# Patient Record
Sex: Female | Born: 1995 | Race: White | Hispanic: No | Marital: Married | Smoking: Never smoker
Health system: Southern US, Community
[De-identification: ages and names within clinical notes are randomized; demographics above are authoritative.]

## PROBLEM LIST (undated history)

## (undated) DIAGNOSIS — K8020 Calculus of gallbladder without cholecystitis without obstruction: Secondary | ICD-10-CM

## (undated) DIAGNOSIS — Q639 Congenital malformation of kidney, unspecified: Secondary | ICD-10-CM

## (undated) HISTORY — PX: NO PAST SURGERIES: SHX2092

## (undated) HISTORY — DX: Calculus of gallbladder without cholecystitis without obstruction: K80.20

---
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
## 2018-04-12 ENCOUNTER — Encounter: Payer: Self-pay | Admitting: Obstetrics and Gynecology

## 2018-04-12 ENCOUNTER — Ambulatory Visit (INDEPENDENT_AMBULATORY_CARE_PROVIDER_SITE_OTHER): Payer: 59 | Admitting: Obstetrics and Gynecology

## 2018-04-12 ENCOUNTER — Other Ambulatory Visit (HOSPITAL_COMMUNITY)
Admission: RE | Admit: 2018-04-12 | Discharge: 2018-04-12 | Disposition: A | Discharge status: Home / Self Care | Payer: 59 | Source: Ambulatory Visit | Attending: Obstetrics and Gynecology | Admitting: Obstetrics and Gynecology

## 2018-04-12 VITALS — BP 118/68 | HR 92 | Ht 62.0 in | Wt 168.0 lb

## 2018-04-12 DIAGNOSIS — Z113 Encounter for screening for infections with a predominantly sexual mode of transmission: Secondary | ICD-10-CM

## 2018-04-12 DIAGNOSIS — T8332XA Displacement of intrauterine contraceptive device, initial encounter: Secondary | ICD-10-CM

## 2018-04-12 DIAGNOSIS — Z0000 Encounter for general adult medical examination without abnormal findings: Secondary | ICD-10-CM

## 2018-04-12 DIAGNOSIS — Z01419 Encounter for gynecological examination (general) (routine) without abnormal findings: Secondary | ICD-10-CM | POA: Diagnosis not present

## 2018-04-12 NOTE — Patient Instructions (Signed)
Intrauterine Device Insertion, Care After    This sheet gives you information about how to care for yourself after your procedure. Your health care provider may also give you more specific instructions. If you have problems or questions, contact your health care provider.  What can I expect after the procedure?  After the procedure, it is common to have:  · Cramps and pain in the abdomen.  · Light bleeding (spotting) or heavier bleeding that is like your menstrual period. This may last for up to a few days.  · Lower back pain.  · Dizziness.  · Headaches.  · Nausea.  Follow these instructions at home:  · Before resuming sexual activity, check to make sure that you can feel the IUD string(s). You should be able to feel the end of the string(s) below the opening of your cervix. If your IUD string is in place, you may resume sexual activity.  ? If you had a hormonal IUD inserted more than 7 days after your most recent period started, you will need to use a backup method of birth control for 7 days after IUD insertion. Ask your health care provider whether this applies to you.  · Continue to check that the IUD is still in place by feeling for the string(s) after every menstrual period, or once a month.  · Take over-the-counter and prescription medicines only as told by your health care provider.  · Do not drive or use heavy machinery while taking prescription pain medicine.  · Keep all follow-up visits as told by your health care provider. This is important.  Contact a health care provider if:  · You have bleeding that is heavier or lasts longer than a normal menstrual cycle.  · You have a fever.  · You have cramps or abdominal pain that get worse or do not get better with medicine.  · You develop abdominal pain that is new or is not in the same area of earlier cramping and pain.  · You feel lightheaded or weak.  · You have abnormal or bad-smelling discharge from your vagina.  · You have pain during sexual  activity.  · You have any of the following problems with your IUD string(s):  ? The string bothers or hurts you or your sexual partner.  ? You cannot feel the string.  ? The string has gotten longer.  · You can feel the IUD in your vagina.  · You think you may be pregnant, or you miss your menstrual period.  · You think you may have an STI (sexually transmitted infection).  Get help right away if:  · You have flu-like symptoms.  · You have a fever and chills.  · You can feel that your IUD has slipped out of place.  Summary  · After the procedure, it is common to have cramps and pain in the abdomen. It is also common to have light bleeding (spotting) or heavier bleeding that is like your menstrual period.  · Continue to check that the IUD is still in place by feeling for the string(s) after every menstrual period, or once a month.  · Keep all follow-up visits as told by your health care provider. This is important.  · Contact your health care provider if you have problems with your IUD string(s), such as the string getting longer or bothering you or your sexual partner.  This information is not intended to replace advice given to you by your health care provider. Make   sure you discuss any questions you have with your health care provider.  Document Released: 10/01/2010 Document Revised: 12/25/2015 Document Reviewed: 12/25/2015  Elsevier Interactive Patient Education © 2019 [employer]

## 2018-04-12 NOTE — Progress Notes (Signed)
Gynecology Annual Exam  PCP: Myrene Buddy, NP  Chief Complaint:  Chief Complaint  Patient presents with  . Gynecologic Exam    Poss IUD removal     History of Present Illness: Patient is a 23 y.o. No obstetric history on file. presents for annual exam. The patient has no complaints today.   LMP: No LMP recorded. (Menstrual status: IUD). Menarche:not applicable Average Interval: irregular,monthly spotting for 1-2 days Duration of flow: 1 days Heavy Menses: no Clots: no Intermenstrual Bleeding: no Postcoital Bleeding: no Dysmenorrhea: no  The patient is sexually active. She currently uses IUD for contraception. She denies dyspareunia.  The patient does perform self breast exams.  There is no notable family history of breast or ovarian cancer in her family.  The patient wears seatbelts: yes.  The patient has regular exercise: no.    The patient denies current symptoms of depression.    Review of Systems: ROS  Past Medical History:  Past Medical History:  Diagnosis Date  . Cholelithiasis 05/2014  . 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 Vaginal delivery 04-2014    Past Surgical History:  Past Surgical History:  Procedure Laterality Date  . NO PAST SURGERIES    . ROBOTIC ASSISTED LAPAROSCOPIC CHOLECYSTECTOMY-SINGLE SITE N/A 08/16/2014   Procedure: ROBOTIC ASSISTED LAPAROSCOPIC CHOLECYSTECTOMY-SINGLE SITE;  Surgeon: Natale Lay, MD;  Location: [facility] ORS;  Service: General;  Laterality: N/A;    Gynecologic History:  No LMP recorded. (Menstrual status: IUD). Contraception: IUD   Obstetric History:  OB History  Gravida Para Term Preterm AB Living  1 1 1     1   SAB TAB Ectopic Multiple Live Births          1    # Outcome Date GA Lbr Len/2nd Weight Sex Delivery Anes PTL Lv  1 Term 05/09/14 [redacted]w[redacted]d  7 lb 1 oz (3.204 kg) F Vag-Spont   LIV     Family History:  Family History  Problem Relation Age of Onset  . Diabetes Mother   .  Hypertension Mother   . Diabetes Paternal Grandmother   . Heart disease Paternal Grandmother   . Hypertension Father     Social History:  Social History   Socioeconomic History  . Marital status: Married    Spouse name: Not on file  . Number of children: Not on file  . Years of education: Not on file  . Highest education level: Not on file  Occupational History  . Not on file  Social Needs  . Financial resource strain: Not on file  . Food insecurity:    Worry: Not on file    Inability: Not on file  . Transportation needs:    Medical: Not on file    Non-medical: Not on file  Tobacco Use  . Smoking status: Never Smoker  . Smokeless tobacco: Never Used  Substance and Sexual Activity  . Alcohol use: No  . Drug use: No  . Sexual activity: Yes    Birth control/protection: I.U.D.    Comment: Mirena   Lifestyle  . Physical activity:    Days per week: Not on file    Minutes per session: Not on file  . Stress: Not on file  Relationships  . Social connections:    Talks on phone: Not on file    Gets together: Not on file    Attends religious service: Not on file    Active member of  club or organization: Not on file    Attends meetings of clubs or organizations: Not on file    Relationship status: Not on file  . Intimate partner violence:    Fear of current or ex partner: Not on file    Emotionally abused: Not on file    Physically abused: Not on file    Forced sexual activity: Not on file  Other Topics Concern  . Not on file  Social History Narrative  . Not on file    Allergies:  No Known Allergies  Medications: Prior to Admission medications   Medication Sig Start Date End Date Taking? Authorizing Provider  HYDROcodone-acetaminophen (NORCO) 5-325 MG per tablet Take 1 tablet by mouth every 6 (six) hours as needed for moderate pain. Patient not taking: Reported on 08/29/2014 08/16/14   Natale Lay, MD  levonorgestrel (MIRENA) 20 MCG/24HR IUD 1 Intra Uterine Device by  Intrauterine route. Every 5 years    [provider]  ondansetron (ZOFRAN) 4 MG tablet Take 1 tablet (4 mg total) by mouth every 8 (eight) hours as needed for nausea or vomiting. 08/16/14   Natale Lay, MD    Physical Exam Vitals: Height  (1.575 m), weight 168 lb (76.2 kg).  General: NAD HEENT: normocephalic, anicteric Thyroid: no enlargement, no palpable nodules Pulmonary: No increased work of breathing, CTAB Cardiovascular: RRR, distal pulses 2+ Breast: Breast symmetrical, no tenderness, no palpable nodules or masses, no skin or nipple retraction present, no nipple discharge.  No axillary or supraclavicular lymphadenopathy. Abdomen: NABS, soft, non-tender, non-distended.  Umbilicus without lesions.  No hepatomegaly, splenomegaly or masses palpable. No evidence of hernia  Genitourinary:  External: Normal external female genitalia.  Normal urethral meatus, normal Bartholin's and Skene's glands.    Vagina: Normal vaginal mucosa, no evidence of prolapse.    Cervix: Grossly normal in appearance, no bleeding- IUD STRINGS NOT SEEN  Uterus: Non-enlarged, mobile, normal contour.  No CMT  Adnexa: ovaries non-enlarged, no adnexal masses  Rectal: deferred  Lymphatic: no evidence of inguinal lymphadenopathy Extremities: no edema, erythema, or tenderness Neurologic: Grossly intact Psychiatric: mood appropriate, affect full  Female chaperone present for pelvic and breast  portions of the physical exam    Assessment: 22 y.o. G1P1001. Routine annual exam  Plan: Problem List Items Addressed This Visit    None    Visit Diagnoses    Health care maintenance    -  Primary   Intrauterine contraceptive device threads lost, initial encounter       Relevant Orders   US PELVIS TRANSVANGINAL NON-OB (TV ONLY)      1) 4) Gardasil Series discussed and if applicable offered to patient - Patient is unsure if she has previously completed 3 shot series. She will verify then consider the HPV  vaccination  2) STI screening was offered and accepted.  3)  ASCCP guidelines and rational discussed.  Patient opts for initiating screening at 21 screening interval  4) Contraception - the patient is currently using  IUD.  She is happy with her current form of contraception and plans to continue. We discussed safe sex practices to reduce her furture risk of STI's.  IUD strings not seen. She will follow up in 1 week for a pelvic US. She is interested in having another pregnancy in the fall. Discussed initiating a prenatal vitamin for at least 1 month prior to conception. Discussed removing IUD when she is ready to start trying to conceive.   5) Return in about 1  week (around 04/19/2018) for Return GYN and [place].  Adelene Idler MD [facility], [facility] 04/12/2018 8:47 AM

## 2018-04-14 LAB — CERVICOVAGINAL ANCILLARY ONLY
CHLAMYDIA, DNA PROBE: NEGATIVE
Neisseria Gonorrhea: NEGATIVE

## 2018-04-15 NOTE — Progress Notes (Signed)
Please call patient with negative result. Thank you!

## 2018-04-19 NOTE — Progress Notes (Signed)
Left vm for pt

## 2018-04-21 ENCOUNTER — Encounter: Payer: Self-pay | Admitting: Obstetrics and Gynecology

## 2018-04-21 ENCOUNTER — Ambulatory Visit (INDEPENDENT_AMBULATORY_CARE_PROVIDER_SITE_OTHER): Payer: 59

## 2018-04-21 ENCOUNTER — Ambulatory Visit (INDEPENDENT_AMBULATORY_CARE_PROVIDER_SITE_OTHER): Payer: 59 | Admitting: Obstetrics and Gynecology

## 2018-04-21 VITALS — BP 116/72 | HR 81 | Ht 62.0 in | Wt 169.0 lb

## 2018-04-21 DIAGNOSIS — T8332XA Displacement of intrauterine contraceptive device, initial encounter: Secondary | ICD-10-CM

## 2018-04-21 DIAGNOSIS — Z30431 Encounter for routine checking of intrauterine contraceptive device: Secondary | ICD-10-CM

## 2018-04-21 DIAGNOSIS — T8332XD Displacement of intrauterine contraceptive device, subsequent encounter: Secondary | ICD-10-CM

## 2018-04-21 NOTE — Progress Notes (Signed)
Patient ID: Kathy Kelley, female   DOB: 03-26-1995, 22 y.o.   MRN: [id number]  Reason for Consult: Follow-up (GYN u/s and follow up )   Referred by Gauger, Hermenia Fiscal, *  Subjective:     HPI:  Kathy Kelley is a 23 y.o. female . She has no complaints. Following up today for lost IUD strings.   Past Medical History:  Diagnosis Date  . Cholelithiasis 05/2014  . 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 Vaginal delivery 04-2014   Family History  Problem Relation Age of Onset  . Diabetes Mother   . Hypertension Mother   . Diabetes Paternal Grandmother   . Heart disease Paternal Grandmother   . Hypertension Father    Past Surgical History:  Procedure Laterality Date  . NO PAST SURGERIES    . ROBOTIC ASSISTED LAPAROSCOPIC CHOLECYSTECTOMY-SINGLE SITE N/A 08/16/2014   Procedure: ROBOTIC ASSISTED LAPAROSCOPIC CHOLECYSTECTOMY-SINGLE SITE;  Surgeon: Natale Lay, MD;  Location: [facility] ORS;  Service: General;  Laterality: N/A;    Short Social History:  Social History   Tobacco Use  . Smoking status: Never Smoker  . Smokeless tobacco: Never Used  Substance Use Topics  . Alcohol use: No    No Known Allergies  Current Outpatient Medications  Medication Sig Dispense Refill  . levonorgestrel (MIRENA) 20 MCG/24HR IUD 1 Intra Uterine Device by Intrauterine route. Every 5 years    . HYDROcodone-acetaminophen (NORCO) 5-325 MG per tablet Take 1 tablet by mouth every 6 (six) hours as needed for moderate pain. (Patient not taking: Reported on 08/29/2014) 30 tablet 0  . ondansetron (ZOFRAN) 4 MG tablet Take 1 tablet (4 mg total) by mouth every 8 (eight) hours as needed for nausea or vomiting. (Patient not taking: Reported on 04/12/2018) 20 tablet 0   No current facility-administered medications for this visit.     Review of Systems  Constitutional: Negative for chills, fatigue, fever and unexpected weight change.  HENT: Negative for trouble swallowing.    Eyes: Negative for loss of vision.  Respiratory: Negative for cough, shortness of breath and wheezing.  Cardiovascular: Negative for chest pain, leg swelling, palpitations and syncope.  GI: Negative for abdominal pain, blood in stool, diarrhea, nausea and vomiting.  GU: Negative for difficulty urinating, dysuria, frequency and hematuria.  Musculoskeletal: Negative for back pain, leg pain and joint pain.  Skin: Negative for rash.  Neurological: Negative for dizziness, headaches, light-headedness, numbness and seizures.  Psychiatric: Negative for behavioral problem, confusion, depressed mood and sleep disturbance.        Objective:  Objective   Vitals:   04/21/18 0919  BP: 116/72  Pulse: 81  Weight: 169 lb (76.7 kg)  Height: 5\' 2"  (1.575 m)   Body mass index is 30.91 kg/m.  Physical Exam Vitals signs and nursing note reviewed.  Constitutional:      Appearance: She is well-developed.  HENT:     Head: Normocephalic and atraumatic.  Eyes:     Pupils: Pupils are equal, round, and reactive to light.  Cardiovascular:     Rate and Rhythm: Normal rate and regular rhythm.  Pulmonary:     Effort: Pulmonary effort is normal. No respiratory distress.  Skin:    General: Skin is warm and dry.  Neurological:     Mental Status: She is alert and oriented to person, place, and time.  Psychiatric:        Behavior: Behavior normal.  Thought Content: Thought content normal.        Judgment: Judgment normal.          Assessment/Plan:     23 yo with lost IUD strings IUD seen in correct position in the uterus.  Follow up as needed.   More than 5 minutes were spent face to face with the patient in the room with more than 50% of the time spent providing counseling and discussing the plan of management.   I have reviewed this ultrasound and the report. I agree with the above assessment and plan.  Adelene Idler MD [facility] 04/21/18 9:28  AM

## 2019-02-03 ENCOUNTER — Other Ambulatory Visit: Payer: Self-pay

## 2019-02-03 ENCOUNTER — Ambulatory Visit: Payer: 59 | Attending: Internal Medicine

## 2019-02-03 DIAGNOSIS — Z20822 Contact with and (suspected) exposure to covid-19: Secondary | ICD-10-CM

## 2019-02-04 LAB — NOVEL CORONAVIRUS, NAA: SARS-CoV-2, NAA: NOT DETECTED

## 2019-05-04 ENCOUNTER — Encounter: Payer: Self-pay | Admitting: Obstetrics and Gynecology

## 2019-05-04 ENCOUNTER — Ambulatory Visit (INDEPENDENT_AMBULATORY_CARE_PROVIDER_SITE_OTHER): Payer: 59 | Admitting: Obstetrics and Gynecology

## 2019-05-04 ENCOUNTER — Other Ambulatory Visit (HOSPITAL_COMMUNITY)
Admission: RE | Admit: 2019-05-04 | Discharge: 2019-05-04 | Disposition: A | Discharge status: Home / Self Care | Payer: 59 | Source: Ambulatory Visit | Attending: Obstetrics and Gynecology | Admitting: Obstetrics and Gynecology

## 2019-05-04 ENCOUNTER — Other Ambulatory Visit: Payer: Self-pay

## 2019-05-04 VITALS — BP 120/70 | Ht 62.0 in | Wt 173.0 lb

## 2019-05-04 DIAGNOSIS — Z30432 Encounter for removal of intrauterine contraceptive device: Secondary | ICD-10-CM

## 2019-05-04 DIAGNOSIS — Z113 Encounter for screening for infections with a predominantly sexual mode of transmission: Secondary | ICD-10-CM | POA: Diagnosis present

## 2019-05-04 DIAGNOSIS — Z124 Encounter for screening for malignant neoplasm of cervix: Secondary | ICD-10-CM | POA: Insufficient documentation

## 2019-05-04 DIAGNOSIS — Z3009 Encounter for other general counseling and advice on contraception: Secondary | ICD-10-CM

## 2019-05-04 NOTE — Progress Notes (Signed)
Patient ID: CECLIA Kelley, female   DOB: [birth year], 24 y.o.   MRN: [id number]  Reason for Consult: Follow-up (IUD removal, wants a different BC)   Referred by Gauger, Hermenia Fiscal, *  Subjective:     HPI:  Kathy Kelley is a 24 y.o. female she presents today for consultation regarding birth control.  She reports that her and her significant other are interested in attempting to conceive at some point in the next year although they are not currently ready.  She is considering changing from an IUD due to from fourth form of birth control and would like to discuss her options.  She reports that she has been happy with the IUD.  She does not have a monthly.  Although sometimes has noticed some light pink spotting.  She reports she had her IUD inserted in April 2016.  Past Medical History:  Diagnosis Date  . Cholelithiasis 05/2014  . 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 Vaginal delivery 04-2014   Family History  Problem Relation Age of Onset  . Diabetes Mother   . Hypertension Mother   . Diabetes Paternal Grandmother   . Heart disease Paternal Grandmother   . Hypertension Father    Past Surgical History:  Procedure Laterality Date  . NO PAST SURGERIES    . ROBOTIC ASSISTED LAPAROSCOPIC CHOLECYSTECTOMY-SINGLE SITE N/A 08/16/2014   Procedure: ROBOTIC ASSISTED LAPAROSCOPIC CHOLECYSTECTOMY-SINGLE SITE;  Surgeon: Natale Lay, MD;  Location: [facility] ORS;  Service: General;  Laterality: N/A;    Short Social History:  Social History   Tobacco Use  . Smoking status: Never Smoker  . Smokeless tobacco: Never Used  Substance Use Topics  . Alcohol use: No    No Known Allergies  Current Outpatient Medications  Medication Sig Dispense Refill  . HYDROcodone-acetaminophen (NORCO) 5-325 MG per tablet Take 1 tablet by mouth every 6 (six) hours as needed for moderate pain. (Patient not taking: Reported on 08/29/2014) 30 tablet 0  . levonorgestrel (MIRENA) 20  MCG/24HR IUD 1 Intra Uterine Device by Intrauterine route. Every 5 years    . ondansetron (ZOFRAN) 4 MG tablet Take 1 tablet (4 mg total) by mouth every 8 (eight) hours as needed for nausea or vomiting. (Patient not taking: Reported on 04/12/2018) 20 tablet 0   No current facility-administered medications for this visit.    Review of Systems  Constitutional: Negative for chills, fatigue, fever and unexpected weight change.  HENT: Negative for trouble swallowing.  Eyes: Negative for loss of vision.  Respiratory: Negative for cough, shortness of breath and wheezing.  Cardiovascular: Negative for chest pain, leg swelling, palpitations and syncope.  GI: Negative for abdominal pain, blood in stool, diarrhea, nausea and vomiting.  GU: Negative for difficulty urinating, dysuria, frequency and hematuria.  Musculoskeletal: Negative for back pain, leg pain and joint pain.  Skin: Negative for rash.  Neurological: Negative for dizziness, headaches, light-headedness, numbness and seizures.  Psychiatric: Negative for behavioral problem, confusion, depressed mood and sleep disturbance.        Objective:  Objective   Vitals:   05/04/19 0813  BP: 120/70  Weight: 173 lb (78.5 kg)  Height: 5\' 2"  (1.575 m)   Body mass index is 31.64 kg/m.  Physical Exam Vitals and nursing note reviewed.  Constitutional:      Appearance: She is well-developed.  HENT:     Head: Normocephalic and atraumatic.  Eyes:     Pupils: Pupils are equal, round,  and reactive to light.  Cardiovascular:     Rate and Rhythm: Normal rate and regular rhythm.  Pulmonary:     Effort: Pulmonary effort is normal. No respiratory distress.  Skin:    General: Skin is warm and dry.  Neurological:     Mental Status: She is alert and oriented to person, place, and time.  Psychiatric:        Behavior: Behavior normal.        Thought Content: Thought content normal.        Judgment: Judgment normal.        Assessment/Plan:      24 year old here for birth control consultation. Discussed her options of leaving the Mirena IUD in place for 1 more year since it has received FDA approval for 6 years versus removal of the IUD and transition to an alternative birth control method.  After a careful discussion of all of her birth control options, Dr. Jarrett Soho decided that she would like to leave the Mirena IUD in place and will return for a visit when she is ready to attempt conceiving.  Discussed initiation of prenatal vitamin for 2 3 months before attempting to conceive which can help reduce nausea and vomiting associated with pregnancy as well as risks for neural tube defects.  Age-appropriate screening: Pap smear performed today along with gonorrhea chlamydia testing  More than 25 minutes were spent face to face with the patient in the room with more than 50% of the time spent providing counseling and discussing the plan of management.   Adrian Prows MD [facility], [facility] 05/04/2019 8:45 AM

## 2019-05-10 LAB — CYTOLOGY - PAP
Chlamydia: NEGATIVE
Comment: NEGATIVE
Comment: NEGATIVE
Comment: NORMAL
Diagnosis: NEGATIVE
Neisseria Gonorrhea: NEGATIVE
Trichomonas: NEGATIVE

## 2020-08-28 ENCOUNTER — Ambulatory Visit (INDEPENDENT_AMBULATORY_CARE_PROVIDER_SITE_OTHER): Payer: 59 | Admitting: Obstetrics and Gynecology

## 2020-08-28 ENCOUNTER — Encounter: Payer: Self-pay | Admitting: Obstetrics and Gynecology

## 2020-08-28 ENCOUNTER — Other Ambulatory Visit: Payer: Self-pay

## 2020-08-28 VITALS — BP 142/86 | Ht 62.0 in | Wt 179.0 lb

## 2020-08-28 DIAGNOSIS — Z30432 Encounter for removal of intrauterine contraceptive device: Secondary | ICD-10-CM

## 2020-08-28 DIAGNOSIS — Z30011 Encounter for initial prescription of contraceptive pills: Secondary | ICD-10-CM

## 2020-08-28 MED ORDER — LO LOESTRIN FE 1 MG-10 MCG / 10 MCG PO TABS: 1.0000 | ORAL_TABLET | Freq: Every day | ORAL | 3 refills | Status: DC

## 2020-08-28 NOTE — Progress Notes (Signed)
   GYNECOLOGY OFFICE PROCEDURE NOTE  Kathy Kelley is a 25 y.o. G1P1001 here for Mirena IUD removal placed 1 year ago.   IUD Removal  Patient identified, informed consent performed, consent signed.  Patient was in the dorsal lithotomy position, normal external genitalia was noted.  A speculum was placed in the patient's vagina, normal discharge was noted, no lesions. The cervix was visualized, no lesions, no abnormal discharge.  The strings of the IUD were grasped and pulled using ring forceps. The strings of the IUD were not visualized, so [name] forceps were introduced into the endometrial cavity and the IUD was grasped and removed in its entirety.  Patient tolerated the procedure well.    Patient will use OCP for contraception.  Patient plans OCPs for contraception. Pros and cons and systemic estrogen discussed with patient. She is not breast feeding, nor does she have any other medical contra-indications to estrogen use as listed in WHO guidelines for contraceptive use.  While effective at preventing pregnancy combination oral contraceptive pills do not prevent transmission of sexually transmitted diseases and use of barrier methods for this purpose was discussed.   Vena Austria, MD, [facility], [facility]

## 2020-11-18 ENCOUNTER — Telehealth: Payer: Self-pay

## 2020-11-18 NOTE — Telephone Encounter (Signed)
Pt calling; has NOB appt 10/14th; needs nausea meds.  [phone]  Adv vitamin B6 10-24mg  q8hrs, unisom 25mg  at HS and 12.5 in am; wrist bands, nausea suckers, and ginger drops otc.

## 2020-11-29 ENCOUNTER — Other Ambulatory Visit (HOSPITAL_COMMUNITY)
Admission: RE | Admit: 2020-11-29 | Discharge: 2020-11-29 | Disposition: A | Discharge status: Home / Self Care | Payer: 59 | Source: Ambulatory Visit | Attending: Obstetrics | Admitting: Obstetrics

## 2020-11-29 ENCOUNTER — Encounter: Payer: Self-pay | Admitting: Obstetrics

## 2020-11-29 ENCOUNTER — Other Ambulatory Visit: Payer: Self-pay

## 2020-11-29 ENCOUNTER — Ambulatory Visit (INDEPENDENT_AMBULATORY_CARE_PROVIDER_SITE_OTHER): Payer: 59 | Admitting: Obstetrics

## 2020-11-29 VITALS — BP 122/74 | Wt 177.0 lb

## 2020-11-29 DIAGNOSIS — B9689 Other specified bacterial agents as the cause of diseases classified elsewhere: Secondary | ICD-10-CM | POA: Insufficient documentation

## 2020-11-29 DIAGNOSIS — O0991 Supervision of high risk pregnancy, unspecified, first trimester: Secondary | ICD-10-CM | POA: Diagnosis not present

## 2020-11-29 DIAGNOSIS — N912 Amenorrhea, unspecified: Secondary | ICD-10-CM

## 2020-11-29 DIAGNOSIS — O0990 Supervision of high risk pregnancy, unspecified, unspecified trimester: Secondary | ICD-10-CM

## 2020-11-29 DIAGNOSIS — Z113 Encounter for screening for infections with a predominantly sexual mode of transmission: Secondary | ICD-10-CM | POA: Diagnosis present

## 2020-11-29 DIAGNOSIS — Z3480 Encounter for supervision of other normal pregnancy, unspecified trimester: Secondary | ICD-10-CM

## 2020-11-29 DIAGNOSIS — Z3A01 Less than 8 weeks gestation of pregnancy: Secondary | ICD-10-CM | POA: Diagnosis present

## 2020-11-29 DIAGNOSIS — O219 Vomiting of pregnancy, unspecified: Secondary | ICD-10-CM

## 2020-11-29 DIAGNOSIS — O23591 Infection of other part of genital tract in pregnancy, first trimester: Secondary | ICD-10-CM | POA: Diagnosis not present

## 2020-11-29 MED ORDER — ONDANSETRON 4 MG PO TBDP: 4.0000 mg | ORAL_TABLET | Freq: Four times a day (QID) | ORAL | 0 refills | Status: DC | PRN

## 2020-11-29 NOTE — Progress Notes (Signed)
NOB today. No vb. No lof.  LMP 8/21. Pt has been very nauseas.

## 2020-11-29 NOTE — Progress Notes (Signed)
New Obstetric Patient H&P    Chief Complaint: "Desires prenatal care"   History of Present Illness: Patient is a 25 y.o. G2P1001 Not Hispanic or Latino female, LMP 10/06/2020 presents with amenorrhea and positive home pregnancy test. Based on her  LMP, her EDD is Estimated Date of Delivery: 07/13/21 and her EGA is [redacted]w[redacted]d. Cycles are 4. days, and she had only one period after removal of her IUD before conceiving., and occur approximately every : not applicable days. Her last pap smear was 1 years ago and was no abnormalities.    She had a urine pregnancy test which was positive about 2-3 week(s)  ago. Her last menstrual period was normal and lasted for  4 day(s). Since her LMP she claims she has experienced breast tenderness and nausea. She denies vaginal bleeding. Her past medical history is noncontributory. Her prior pregnancies are notable for none  Since her LMP, she admits to the use of tobacco products  no She claims she has gained   no pounds since the start of her pregnancy.  There are cats in the home in the home  no She admits close contact with children on a regular basis  no  She has had chicken pox in the past unknown She has had Tuberculosis exposures, symptoms, or previously tested positive for TB   no Current or past history of domestic violence. no  Genetic Screening/Teratology Counseling: (Includes patient, baby's father, or anyone in either family with:)   1. Patient's age >/= 71 at [facility]  no 2. Thalassemia ([place], [place], Mediterranean, or Asian background): MCV<80  no 3. Neural tube defect (meningomyelocele, spina bifida, anencephaly)  no 4. Congenital heart defect  no  5. Down syndrome  no 6. Tay-Sachs (Jewish, [place]  no 7. Canavan's Disease  no 8. Sickle cell disease or trait (African)  no  9. Hemophilia or other blood disorders  no  10. Muscular dystrophy  no  11. Cystic fibrosis  no  12. Huntington's Chorea  no  13. Mental retardation/autism   no 14. Other inherited genetic or chromosomal disorder  no 15. Maternal metabolic disorder (DM, PKU, etc)  no 16. Patient or FOB with a child with a birth defect not listed above no  16a. Patient or FOB with a birth defect themselves no 17. Recurrent pregnancy loss, or stillbirth  no  18. Any medications since LMP other than prenatal vitamins (include vitamins, supplements, OTC meds, drugs, alcohol)  no 19. Any other genetic/environmental exposure to discuss  no  Infection History:   1. Lives with someone with TB or TB exposed  no  2. Patient or partner has history of genital herpes  no 3. Rash or viral illness since LMP  no 4. History of STI (GC, CT, HPV, syphilis, HIV)  no 5. History of recent travel :  no  Other pertinent information:  no     Review of Systems:10 point review of systems negative unless otherwise noted in HPI  Past Medical History:  Past Medical History:  Diagnosis Date  . Cholelithiasis 05/2014  . 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 Vaginal delivery 04-2014    Past Surgical History:  Past Surgical History:  Procedure Laterality Date  . NO PAST SURGERIES    . ROBOTIC ASSISTED LAPAROSCOPIC CHOLECYSTECTOMY-SINGLE SITE N/A 08/16/2014   Procedure: ROBOTIC ASSISTED LAPAROSCOPIC CHOLECYSTECTOMY-SINGLE SITE;  Surgeon: Natale Lay, MD;  Location: [facility] ORS;  Service: General;  Laterality: N/A;    Gynecologic History: Patient's last menstrual period was 10/06/2020 (exact date).  Obstetric History: G2P1001  Family History:  Family History  Problem Relation Age of Onset  . Diabetes Mother   . Hypertension Mother   . Diabetes Paternal Grandmother   . Heart disease Paternal Grandmother   . Hypertension Father     Social History:  Social History   Socioeconomic History  . Marital status: Married    Spouse name: Not on file  . Number of children: Not on file  . Years of education: Not on file  . Highest education level: Not on  file  Occupational History  . Not on file  Tobacco Use  . Smoking status: Never  . Smokeless tobacco: Never  Vaping Use  . Vaping Use: Never used  Substance and Sexual Activity  . Alcohol use: No  . Drug use: No  . Sexual activity: Yes    Birth control/protection: I.U.D.    Comment: Mirena   Other Topics Concern  . Not on file  Social History Narrative  . Not on file   Social Determinants of Health   Financial Resource Strain: Not on file  Food Insecurity: Not on file  Transportation Needs: Not on file  Physical Activity: Not on file  Stress: Not on file  Social Connections: Not on file  Intimate Partner Violence: Not on file    Allergies:  No Known Allergies  Medications: Prior to Admission medications   Medication Sig Start Date End Date Taking? Authorizing Provider  ondansetron (ZOFRAN ODT) 4 MG disintegrating tablet Take 1 tablet (4 mg total) by mouth every 6 (six) hours as needed for nausea. 11/29/20  Yes Mirna Mires, CNM    Physical Exam Vitals: Blood pressure 122/74, weight 177 lb (80.3 kg), last menstrual period 10/06/2020.  General: NAD HEENT: normocephalic, anicteric Thyroid: no enlargement, no palpable nodules Pulmonary: No increased work of breathing, CTAB Cardiovascular: RRR, distal pulses 2+ Abdomen: NABS, soft, non-tender, non-distended.  Umbilicus without lesions.  No hepatomegaly, splenomegaly or masses palpable. No evidence of hernia  Genitourinary:  External: Normal external female genitalia.  Normal urethral meatus, normal  Bartholin's and Skene's glands.    Vagina: Normal vaginal mucosa, no evidence of prolapse.    Cervix: Grossly normal in appearance, no bleeding  Uterus: slightly retroverted,  Non-enlarged, mobile, normal contour.  No CMT  Adnexa: ovaries non-enlarged, no adnexal masses  Rectal: deferred Extremities: no edema, erythema, or tenderness Neurologic: Grossly intact Psychiatric: mood appropriate, affect  full   Assessment: 25 y.o. G2P1001 at [redacted]w[redacted]d presenting to initiate prenatal care  Plan: 1) Avoid alcoholic beverages. 2) Patient encouraged not to smoke.  3) Discontinue the use of all non-medicinal drugs and chemicals.  4) Take prenatal vitamins daily.  5) Nutrition, food safety (fish, cheese advisories, and high nitrite foods) and exercise discussed. 6) Hospital and practice style discussed with cross coverage system.  7) Genetic Screening, such as with 1st Trimester Screening, cell free fetal DNA, AFP testing, and Ultrasound, as well as with amniocentesis and CVS as appropriate, is discussed with patient. At the conclusion of today's visit patient requested genetic testing 8) Patient is asked about travel to areas at risk for the Zika virus, and counseled to avoid travel and exposure to mosquitoes or sexual partners who may have themselves been exposed to the virus. Testing is discussed, and will be ordered as appropriate.  The following were addressed during this visit:  Breastfeeding Education - Early initiation of  breastfeeding    Comments: Keeps milk supply adequate, helps contract uterus and slow bleeding, and early milk is the perfect first food and is easy to digest.   - The importance of exclusive breastfeeding    Comments: Provides antibodies, Lower risk of breast and ovarian cancers, and type-2 diabetes,Helps your body recover, Reduced chance of SIDS.   - Risks of giving your baby anything other than breast milk if you are breastfeeding    Comments: Make the baby less content with breastfeeds, may make my baby more susceptible to illness, and may reduce my milk supply.   - The importance of early skin-to-skin contact    Comments: Keeps baby warm and secure, helps keep baby's blood sugar up and breathing steady, easier to bond and breastfeed, and helps calm baby.  - Rooming-in on a 24-hour basis    Comments: Easier to learn baby's feeding cues, easier to bond and get to  know each other, and encourages milk production.   - Feeding on demand or baby-led feeding    Comments: Helps prevent breastfeeding complications, helps bring in good milk supply, prevents under or overfeeding, and helps baby feel content and satisfied   - Frequent feeding to help assure optimal milk production    Comments: Making a full supply of milk requires frequent removal of milk from breasts, infant will eat 8-12 times in 24 hours, if separated from infant use breast massage, hand expression and/ or pumping to remove milk from breasts.   - Effective positioning and attachment    Comments: Helps my baby to get enough breast milk, helps to produce an adequate milk supply, and helps prevent nipple pain and damage   - Exclusive breastfeeding for the first 6 months    Comments: Builds a healthy milk supply and keeps it up, protects baby from sickness and disease, and breastmilk has everything your baby needs for the first 6 months.   A dating scan is ordered for her next visit. She will return for her next ROB and have an early GTT due to her BMI. Pap smear today with cultures, she will have bloodwork , including the MaternT test at her next visit. RTC in 3 weeks to see an MD Mirna Mires, CNM  11/29/2020 12:32 PM

## 2020-12-02 LAB — CERVICOVAGINAL ANCILLARY ONLY
Bacterial Vaginitis (gardnerella): POSITIVE — AB
Candida Glabrata: NEGATIVE
Candida Vaginitis: NEGATIVE
Chlamydia: NEGATIVE
Comment: NEGATIVE
Comment: NEGATIVE
Comment: NEGATIVE
Comment: NEGATIVE
Comment: NEGATIVE
Comment: NORMAL
Neisseria Gonorrhea: NEGATIVE
Trichomonas: NEGATIVE

## 2020-12-03 ENCOUNTER — Other Ambulatory Visit: Payer: Self-pay | Admitting: Obstetrics

## 2020-12-03 DIAGNOSIS — B9689 Other specified bacterial agents as the cause of diseases classified elsewhere: Secondary | ICD-10-CM

## 2020-12-03 DIAGNOSIS — N760 Acute vaginitis: Secondary | ICD-10-CM

## 2020-12-03 MED ORDER — METRONIDAZOLE 500 MG PO TABS: 500.0000 mg | ORAL_TABLET | Freq: Two times a day (BID) | ORAL | 0 refills | Status: AC

## 2020-12-03 NOTE — Progress Notes (Signed)
+  BV noted at her NOB. Rx sent in for Metronidazole. Phone call to patient- left voicemail.  Mirna Mires, CNM  12/03/2020 11:09 AM

## 2020-12-04 LAB — URINE CULTURE

## 2020-12-09 ENCOUNTER — Other Ambulatory Visit: Payer: Self-pay

## 2020-12-09 DIAGNOSIS — O219 Vomiting of pregnancy, unspecified: Secondary | ICD-10-CM

## 2020-12-13 ENCOUNTER — Other Ambulatory Visit: Payer: Self-pay | Admitting: Obstetrics

## 2020-12-13 ENCOUNTER — Telehealth: Payer: Self-pay

## 2020-12-13 DIAGNOSIS — O219 Vomiting of pregnancy, unspecified: Secondary | ICD-10-CM

## 2020-12-13 MED ORDER — ONDANSETRON 4 MG PO TBDP: 4.0000 mg | ORAL_TABLET | Freq: Four times a day (QID) | ORAL | 0 refills | Status: DC | PRN

## 2020-12-13 NOTE — Telephone Encounter (Signed)
Pt calling; doesn't understand why rx for odansetron was denied.  She has four left and her next appt isn't until 12/23/20.  She will be pretty sick if she doesn't get the refills.  [phone]

## 2020-12-16 NOTE — Telephone Encounter (Signed)
Left detailed msg odansetron has been refilled.

## 2020-12-23 ENCOUNTER — Encounter: Payer: 59 | Admitting: Advanced Practice Midwife

## 2020-12-23 ENCOUNTER — Other Ambulatory Visit: Payer: 59

## 2020-12-24 ENCOUNTER — Other Ambulatory Visit: Payer: Self-pay

## 2020-12-24 ENCOUNTER — Ambulatory Visit (INDEPENDENT_AMBULATORY_CARE_PROVIDER_SITE_OTHER): Payer: 59 | Admitting: Obstetrics & Gynecology

## 2020-12-24 ENCOUNTER — Other Ambulatory Visit: Payer: Self-pay | Admitting: Obstetrics & Gynecology

## 2020-12-24 ENCOUNTER — Other Ambulatory Visit: Payer: 59

## 2020-12-24 VITALS — BP 120/70 | Wt 171.0 lb

## 2020-12-24 DIAGNOSIS — Z369 Encounter for antenatal screening, unspecified: Secondary | ICD-10-CM

## 2020-12-24 DIAGNOSIS — O0990 Supervision of high risk pregnancy, unspecified, unspecified trimester: Secondary | ICD-10-CM

## 2020-12-24 DIAGNOSIS — Z3A01 Less than 8 weeks gestation of pregnancy: Secondary | ICD-10-CM

## 2020-12-24 DIAGNOSIS — Z1379 Encounter for other screening for genetic and chromosomal anomalies: Secondary | ICD-10-CM

## 2020-12-24 DIAGNOSIS — Z3689 Encounter for other specified antenatal screening: Secondary | ICD-10-CM

## 2020-12-24 NOTE — Progress Notes (Signed)
  Subjective  Less nausea.  No pain or bleeding  Objective  BP 120/70   Wt 171 lb (77.6 kg)   LMP 10/06/2020 (Exact Date)   BMI 31.28 kg/m  General: NAD Pumonary: no increased work of breathing Abdomen: gravid, non-tender Extremities: no edema Psychiatric: mood appropriate, affect full  Assessment  25 y.o. G2P1001 at [redacted]w[redacted]d by  07/13/2021, by Last Menstrual Period presenting for routine prenatal visit  Plan   Problem List Items Addressed This Visit     Supervision of high risk pregnancy, antepartum    -  Primary   Prenatal screening encounter       Relevant Orders   Hepatitis C antibody, also Prenatal Labs   Encounter for genetic screening for Down Syndrome       Relevant Orders   MaterniT21 PLUS Core+SCA   Screening, antenatal, for fetal anatomic survey       Relevant Orders   US OB Comp + 14 Wk at 19-20 weeks    Labs today [place] today, see report.   Keep EDC.   Annamarie Major, MD, [facility], [facility] 12/24/2020  11:05 AM

## 2020-12-24 NOTE — Patient Instructions (Signed)
Recommendations to boost your immunity to prevent illness such as viral flu and colds, including covid19, are as follows:       - - -  Vitamin K2 and Vitamin D3  - - - Take Vitamin K2 at 200-300 mcg daily (usually 2-3 pills daily of the over the counter formulation). Take Vitamin D3 at 3000-4000 U daily (usually 3-4 pills daily of the over the counter formulation). Studies show that these two at high normal levels in your system are very effective in keeping your immunity so strong and protective that you will be unlikely to contract viral illness such as those listed above.  Dr Zerenity Bowron  Commonly Asked Questions During Pregnancy  Cats: A parasite can be excreted in cat feces.  To avoid exposure you need to have another person empty the little box.  If you must empty the litter box you will need to wear gloves.  Wash your hands after handling your cat.  This parasite can also be found in raw or undercooked meat so this should also be avoided.  Colds, Sore Throats, Flu: Please check your medication sheet to see what you can take for symptoms.  If your symptoms are unrelieved by these medications please call the office.  Dental Work: Most any dental work your dentist recommends is permitted.  X-rays should only be taken during the first trimester if absolutely necessary.  Your abdomen should be shielded with a lead apron during all x-rays.  Please notify your provider prior to receiving any x-rays.  Novocaine is fine; gas is not recommended.  If your dentist requires a note from us prior to dental work please call the office and we will provide one for you.  Exercise: Exercise is an important part of staying healthy during your pregnancy.  You may continue most exercises you were accustomed to prior to pregnancy.  Later in your pregnancy you will most likely notice you have difficulty with activities requiring balance like riding a bicycle.  It is important that you listen to your body and avoid  activities that put you at a higher risk of falling.  Adequate rest and staying well hydrated are a must!  If you have questions about the safety of specific activities ask your provider.    Exposure to Children with illness: Try to avoid obvious exposure; report any symptoms to us when noted,  If you have chicken pos, red measles or mumps, you should be immune to these diseases.   Please do not take any vaccines while pregnant unless you have checked with your OB provider.  Fetal Movement: After 28 weeks we recommend you do "kick counts" twice daily.  Lie or sit down in a calm quiet environment and count your baby movements "kicks".  You should feel your baby at least 10 times per hour.  If you have not felt 10 kicks within the first hour get up, walk around and have something sweet to eat or drink then repeat for an additional hour.  If count remains less than 10 per hour notify your provider.  Fumigating: Follow your pest control agent's advice as to how long to stay out of your home.  Ventilate the area well before re-entering.  Hemorrhoids:   Most over-the-counter preparations can be used during pregnancy.  Check your medication to see what is safe to use.  It is important to use a stool softener or fiber in your diet and to drink lots of liquids.  If hemorrhoids seem to be   getting worse please call the office.   Hot Tubs:  Hot tubs Jacuzzis and saunas are not recommended while pregnant.  These increase your internal body temperature and should be avoided.  Intercourse:  Sexual intercourse is safe during pregnancy as long as you are comfortable, unless otherwise advised by your provider.  Spotting may occur after intercourse; report any bright red bleeding that is heavier than spotting.  Labor:  If you know that you are in labor, please go to the hospital.  If you are unsure, please call the office and let us help you decide what to do.  Lifting, straining, etc:  If your job requires heavy  lifting or straining please check with your provider for any limitations.  Generally, you should not lift items heavier than that you can lift simply with your hands and arms (no back muscles)  Painting:  Paint fumes do not harm your pregnancy, but may make you ill and should be avoided if possible.  Latex or water based paints have less odor than oils.  Use adequate ventilation while painting.  Permanents & Hair Color:  Chemicals in hair dyes are not recommended as they cause increase hair dryness which can increase hair loss during pregnancy.  " Highlighting" and permanents are allowed.  Dye may be absorbed differently and permanents may not hold as well during pregnancy.  Sunbathing:  Use a sunscreen, as skin burns easily during pregnancy.  Drink plenty of fluids; avoid over heating.  Tanning Beds:  Because their possible side effects are still unknown, tanning beds are not recommended.  Ultrasound Scans:  Routine ultrasounds are performed at approximately 20 weeks.  You will be able to see your baby's general anatomy an if you would like to know the gender this can usually be determined as well.  If it is questionable when you conceived you may also receive an ultrasound early in your pregnancy for dating purposes.  Otherwise ultrasound exams are not routinely performed unless there is a medical necessity.  Although you can request a scan we ask that you pay for it when conducted because insurance does not cover " patient request" scans.  Work: If your pregnancy proceeds without complications you may work until your due date, unless your physician or employer advises otherwise.  Round Ligament Pain/Pelvic Discomfort:  Sharp, shooting pains not associated with bleeding are fairly common, usually occurring in the second trimester of pregnancy.  They tend to be worse when standing up or when you remain standing for long periods of time.  These are the result of pressure of certain pelvic ligaments  called "round ligaments".  Rest, Tylenol and heat seem to be the most effective relief.  As the womb and fetus grow, they rise out of the pelvis and the discomfort improves.  Please notify the office if your pain seems different than that described.  It may represent a more serious condition.   

## 2020-12-24 NOTE — Progress Notes (Signed)
ULTRASOUND REPORT  Location: [facility] Date of Service: 12/24/2020   Indications:Unsure LMP, one period after IUD removal Findings:  Singleton intrauterine pregnancy is visualized with a CRL consistent with [redacted]w[redacted]d gestation, giving an (U/S) EDD of 07/15/21. The (U/S) EDD is consistent with the clinically established EDD of 07/13/21.  FHR: 150 BPM CRL measurement: 41 mm Yolk sac is not visualized. Amnion: visualized and appears normal   Right Ovary is normal in appearance. Left Ovary is normal appearance. Corpus luteal cyst:  is not visualized Survey of the adnexa demonstrates no adnexal masses. There is no free peritoneal fluid in the cul de sac.  Impression: 1. [redacted]w[redacted]d Viable Singleton Intrauterine pregnancy by U/S. 2. (U/S) EDD is consistent with Clinically established EDD of 07/13/21.  Recommendations: 1.Clinical correlation with the patient's History and Physical Exam. 2. Keep EDC  Letitia Libra, MD

## 2020-12-26 LAB — RPR+RH+ABO+RUB AB+AB SCR+CB...
Antibody Screen: NEGATIVE
HIV Screen 4th Generation wRfx: NONREACTIVE
Hematocrit: 40.9 % (ref 34.0–46.6)
Hemoglobin: 13.8 g/dL (ref 11.1–15.9)
Hepatitis B Surface Ag: NEGATIVE
MCH: 29.7 pg (ref 26.6–33.0)
MCHC: 33.7 g/dL (ref 31.5–35.7)
MCV: 88 fL (ref 79–97)
Platelets: 145 10*3/uL — ABNORMAL LOW (ref 150–450)
RBC: 4.64 x10E6/uL (ref 3.77–5.28)
RDW: 12.1 % (ref 11.7–15.4)
RPR Ser Ql: NONREACTIVE
Rh Factor: POSITIVE
Rubella Antibodies, IGG: 4.31 index (ref >0.99)
Varicella zoster IgG: <135 index — ABNORMAL LOW (ref >165)
WBC: 3.8 10*3/uL (ref 3.4–10.8)

## 2020-12-29 LAB — MATERNIT21 PLUS CORE+SCA
Fetal Fraction: 7
Monosomy X (Turner Syndrome): NOT DETECTED
Result (T21): NEGATIVE
Trisomy 13 (Patau syndrome): NEGATIVE
Trisomy 18 (Edwards syndrome): NEGATIVE
Trisomy 21 (Down syndrome): NEGATIVE
XXX (Triple X Syndrome): NOT DETECTED
XXY (Klinefelter Syndrome): NOT DETECTED
XYY (Jacobs Syndrome): NOT DETECTED

## 2020-12-30 LAB — HEPATITIS C ANTIBODY: Hep C Virus Ab: <0.1 s/co ratio (ref 0.0–0.9)

## 2020-12-30 LAB — SPECIMEN STATUS REPORT

## 2021-01-03 ENCOUNTER — Other Ambulatory Visit: Payer: Self-pay | Admitting: Obstetrics

## 2021-01-03 DIAGNOSIS — O219 Vomiting of pregnancy, unspecified: Secondary | ICD-10-CM

## 2021-01-03 MED ORDER — ONDANSETRON 4 MG PO TBDP: 4.0000 mg | ORAL_TABLET | Freq: Four times a day (QID) | ORAL | 0 refills | Status: DC | PRN

## 2021-01-20 ENCOUNTER — Other Ambulatory Visit: Payer: Self-pay

## 2021-01-20 ENCOUNTER — Ambulatory Visit
Admission: RE | Admit: 2021-01-20 | Discharge: 2021-01-20 | Disposition: A | Discharge status: Home / Self Care | Payer: 59 | Source: Ambulatory Visit | Attending: Obstetrics & Gynecology | Admitting: Obstetrics & Gynecology

## 2021-01-20 DIAGNOSIS — Z3689 Encounter for other specified antenatal screening: Secondary | ICD-10-CM

## 2021-01-21 ENCOUNTER — Ambulatory Visit (INDEPENDENT_AMBULATORY_CARE_PROVIDER_SITE_OTHER): Payer: 59 | Admitting: Obstetrics

## 2021-01-21 VITALS — BP 122/74 | Wt 166.0 lb

## 2021-01-21 DIAGNOSIS — O0990 Supervision of high risk pregnancy, unspecified, unspecified trimester: Secondary | ICD-10-CM

## 2021-01-21 DIAGNOSIS — Z3A15 15 weeks gestation of pregnancy: Secondary | ICD-10-CM

## 2021-01-21 NOTE — Progress Notes (Signed)
No vb. No lof. Pt has been really nauseas, might want to try another nausea medication.

## 2021-01-21 NOTE — Progress Notes (Signed)
  Routine Prenatal Care Visit  Subjective  Kathy Kelley is a 25 y.o. G2P1001 at [redacted]w[redacted]d being seen today for ongoing prenatal care.  She is currently monitored for the following issues for this high-risk pregnancy and has Solitary kidney, congenital; Calculus of gallbladder without cholecystitis without obstruction; and Supervision of other normal pregnancy, antepartum on their problem list.  ----------------------------------------------------------------------------------- Patient reports nausea and vomiting.    . Vag. Bleeding: None.   . Leaking Fluid denies.  ----------------------------------------------------------------------------------- The following portions of the patient's history were reviewed and updated as appropriate: allergies, current medications, past family history, past medical history, past social history, past surgical history and problem list. Problem list updated.  Objective  Blood pressure 122/74, weight 166 lb (75.3 kg), last menstrual period 10/06/2020. Pregravid weight 177 lb (80.3 kg) Total Weight Gain -11 lb (-4.99 kg) Urinalysis: Urine Protein    Urine Glucose    Fetal Status:           General:  Alert, oriented and cooperative. Patient is in no acute distress.  Skin: Skin is warm and dry. No rash noted.   Cardiovascular: Normal heart rate noted  Respiratory: Normal respiratory effort, no problems with respiration noted  Abdomen: Soft, gravid, appropriate for gestational age. Pain/Pressure: Absent     Pelvic:  Cervical exam deferred        Extremities: Normal range of motion.     Mental Status: Normal mood and affect. Normal behavior. Normal judgment and thought content.   Assessment   25 y.o. G2P1001 at [redacted]w[redacted]d by  07/13/2021, by Last Menstrual Period presenting for routine prenatal visit  Plan   pregnancy Problems (from 11/29/20 to present)    Problem Noted Resolved   Supervision of other normal pregnancy, antepartum 11/29/2020 by Mirna Mires,  CNM No   Overview Addendum 12/26/2020 10:58 AM by Mirna Mires, CNM     Nursing Staff Provider  Office Location  [facility] Dating    Language  English Anatomy US    Flu Vaccine   Genetic Screen  NIPS:   TDaP vaccine    Hgb A1C or  GTT Early : Third trimester :   Covid    LAB RESULTS   Rhogam   Blood Type   B+  Feeding Plan  Antibody  negative  Contraception  Rubella  immune  Circumcision  RPR   NR  Pediatrician   HBsAg   neg  Support Person  HIV  NR  Prenatal Classes  Varicella Non immune    GBS  (For PCN allergy, check sensitivities)   BTL Consent     VBAC Consent  Pap      Hgb Electro      CF      SMA                   Preterm labor symptoms and general obstetric precautions including but not limited to vaginal bleeding, contractions, leaking of fluid and fetal movement were reviewed in detail with the patient. Please refer to After Visit Summary for other counseling recommendations.  Pepcid suggested for her ongling N/V. She takes zofran and also Vit B6 with Unisom. Her sono was ordered by [employer].  Return in about 4 weeks (around 02/18/2021) for return OB.  Mirna Mires, CNM  01/21/2021 9:06 AM

## 2021-01-22 ENCOUNTER — Other Ambulatory Visit: Payer: Self-pay

## 2021-01-22 ENCOUNTER — Other Ambulatory Visit: Payer: Self-pay | Admitting: Obstetrics & Gynecology

## 2021-01-22 ENCOUNTER — Ambulatory Visit
Admission: EM | Admit: 2021-01-22 | Discharge: 2021-01-22 | Disposition: A | Discharge status: Home / Self Care | Payer: 59 | Attending: Emergency Medicine | Admitting: Emergency Medicine

## 2021-01-22 ENCOUNTER — Encounter: Payer: Self-pay | Admitting: Emergency Medicine

## 2021-01-22 DIAGNOSIS — O219 Vomiting of pregnancy, unspecified: Secondary | ICD-10-CM

## 2021-01-22 DIAGNOSIS — Z3A15 15 weeks gestation of pregnancy: Secondary | ICD-10-CM

## 2021-01-22 DIAGNOSIS — O210 Mild hyperemesis gravidarum: Secondary | ICD-10-CM | POA: Diagnosis not present

## 2021-01-22 MED ORDER — ONDANSETRON HCL 4 MG/2ML IJ SOLN
4.0000 mg | Freq: Once | INTRAMUSCULAR | Status: AC
  Administered 2021-01-22: 4 mg via INTRAVENOUS

## 2021-01-22 MED ORDER — ONDANSETRON 8 MG PO TBDP: 8.0000 mg | ORAL_TABLET | Freq: Three times a day (TID) | ORAL | 0 refills | Status: DC | PRN

## 2021-01-22 MED ORDER — SODIUM CHLORIDE 0.9 % IV BOLUS
1000.0000 mL | Freq: Once | INTRAVENOUS | Status: AC
  Administered 2021-01-22: 1000 mL via INTRAVENOUS

## 2021-01-22 NOTE — Discharge Instructions (Signed)
Continue to take your doxylamine and B6 at bedtime for help with your nausea associated with pregnancy.  Use the Zofran ODT every 8 hours as needed for nausea and vomiting.  Continue to sip on clear liquids which include noncaffeinated soda such as ginger ale or Sprite, Pedialyte, Gatorade, water, and broth.  If your nausea vomiting returns or does not help with the medication either return for reevaluation or go to the ER for evaluation.

## 2021-01-22 NOTE — ED Triage Notes (Signed)
Pt presents with vomiting that started yesterday. Pt states she is unable to keep anything down.

## 2021-01-22 NOTE — ED Provider Notes (Signed)
[facility]    CSN: [id number] Arrival date & time: 01/22/21  0827      History   Chief Complaint Chief Complaint  Patient presents with   Emesis    HPI Kathy Kelley is a 25 y.o. female.   HPI  25 year old female here for evaluation of vomiting.  Patient is currently 15 weeks 3 days gestation and has been asked and vomiting her entireake nausea   pregnancy.  She reports that over the last 18 hours her nausea and vomiting has intensified and she has not been able to keep down any fluids.  She is also had a decreased appetite and has not been eating.  She denies any syncope or dizziness.  She has been prescribed Zofran by her OB/GYN but she has run out due to the limited supply she receives due to her insurance.  She is also taking B6 and doxylamine at bedtime which does help some with the nausea.  She has not To take it during the day as it makes her too somnolent.  She denies any fever, abdominal pain, or diarrhea. 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   Calculus of gallbladder without cholecystitis without obstruction 08/08/2014    Past Surgical History:  Procedure Laterality Date   NO PAST SURGERIES     ROBOTIC ASSISTED LAPAROSCOPIC CHOLECYSTECTOMY-SINGLE SITE N/A 08/16/2014   Procedure: ROBOTIC ASSISTED LAPAROSCOPIC CHOLECYSTECTOMY-SINGLE SITE;  Surgeon: Natale Lay, MD;  Location: [facility] ORS;  Service: General;  Laterality: N/A;    OB History     Gravida  2   Para  1   Term  1   Preterm      AB      Living  1      SAB      IAB      Ectopic      Multiple      Live Births  1            Home Medications    Prior to Admission medications   Medication Sig  Start Date End Date Taking? Authorizing Provider  ondansetron (ZOFRAN ODT) 4 MG disintegrating tablet Take 1 tablet (4 mg total) by mouth every 6 (six) hours as needed for nausea. 01/03/21  Yes Nadara Mustard, MD  ondansetron (ZOFRAN-ODT) 8 MG disintegrating tablet Take 1 tablet (8 mg total) by mouth every 8 (eight) hours as needed for nausea or vomiting. 01/22/21  Yes Becky Augusta, NP    Family History Family History  Problem Relation Age of Onset   Diabetes Mother    Hypertension Mother    Diabetes Paternal Grandmother    Heart disease Paternal Grandmother    Hypertension Father     Social History Social History   Tobacco Use   Smoking status: Never   Smokeless tobacco: Never  Vaping Use   Vaping Use: Never used  Substance Use Topics   Alcohol use: No   Drug use: No     Allergies   Patient has no known allergies.   Review of Systems Review of Systems  Constitutional:  Negative for activity change, appetite change and fever.  Gastrointestinal:  Positive for nausea and vomiting. Negative for abdominal pain  and diarrhea.  Neurological:  Negative for dizziness and syncope.  Hematological: Negative.     Physical Exam Triage Vital Signs ED Triage Vitals  Enc Vitals Group     BP 01/22/21 0916 127/83     Pulse Rate 01/22/21 0916 92     Resp --      Temp 01/22/21 0916 98.6 F (37 C)     Temp Source 01/22/21 0916 Oral     SpO2 01/22/21 0916 100 %     Weight --      Height --      Head Circumference --      Peak Flow --      Pain Score 01/22/21 0914 0     Pain Loc --      Pain Edu? --      Excl. in GC? --    No data found.  Updated Vital Signs BP 127/83 (BP Location: Left Arm)   Pulse 92   Temp 98.6 F (37 C) (Oral)   LMP 10/06/2020 (Exact Date)   SpO2 100%   Visual Acuity Right Eye Distance:   Left Eye Distance:   Bilateral Distance:    Right Eye Near:   Left Eye Near:    Bilateral Near:     Physical Exam Vitals and nursing note reviewed.   Constitutional:      General: She is not in acute distress.    Appearance: Normal appearance. She is normal weight. She is not ill-appearing.  HENT:     Head: Normocephalic and atraumatic.     Mouth/Throat:     Mouth: Mucous membranes are dry.     Pharynx: Oropharynx is clear. No posterior oropharyngeal erythema.     Comments: Patient has sticky oral mucous membranes. Eyes:     Extraocular Movements: Extraocular movements intact.     Pupils: Pupils are equal, round, and reactive to light.     Comments: Patient sclera are bright and shiny.  Cardiovascular:     Rate and Rhythm: Normal rate and regular rhythm.     Pulses: Normal pulses.     Heart sounds: Normal heart sounds. No murmur heard.   No gallop.  Pulmonary:     Effort: Pulmonary effort is normal.     Breath sounds: Normal breath sounds. No wheezing, rhonchi or rales.  Skin:    General: Skin is warm and dry.     Capillary Refill: Capillary refill takes less than 2 seconds.     Findings: No erythema or rash.  Neurological:     General: No focal deficit present.     Mental Status: She is alert and oriented to person, place, and time.  Psychiatric:        Mood and Affect: Mood normal.        Behavior: Behavior normal.        Thought Content: Thought content normal.        Judgment: Judgment normal.     UC Treatments / Results  Labs (all labs ordered are listed, but only abnormal results are displayed) Labs Reviewed - No data to display  EKG   Radiology No results found.  Procedures Procedures (including critical care time)  Medications Ordered in UC Medications  ondansetron (ZOFRAN) injection 4 mg (4 mg Intravenous Given 01/22/21 1201)  sodium chloride 0.9 % bolus 1,000 mL (1,000 mLs Intravenous New Bag/Given 01/22/21 1155)    Initial Impression / Assessment and Plan / UC Course  I have reviewed the triage vital signs  and the nursing notes.  Pertinent labs & imaging results that were available during my  care of the patient were reviewed by me and considered in my medical decision making (see chart for details).  Patient is a very pleasant, nontoxic-appearing 25 year old female who is currently [redacted] weeks pregnant here for evaluation of hyperemesis gravidarum.  She has been experiencing nausea and vomiting the entire length of her pregnancy which intensified 18 hours ago.  She states that she has not been able to keep down any fluids or popsicles.  She had a decreased appetite.  She is complaining primarily of a dry mouth.  She is had no syncope or dizziness.  On physical exam patient is alert and oriented and not in any acute distress.  Her sclera are bright and shiny.  Her oral mucous membranes are sticky but they are free of erythema or edema.  Cardiopulmonary exam shows clear lung sounds in all fields.  Patient has normal skin turgor.  Patient has been using Zofran but she has out due to limited supply that her insurance gives her each month.  She has requested a refill from her OB/GYN.  She is also taking the equivalent of diplegia's with doxylamine and B6 at bedtime.  Will give patient a liter of normal saline and IV Zofran.  She is sipping on water and is tolerating it at present.  Patient has received approximately 500 mL of normal saline.  She states that she is feeling better and is willing to try a p.o. challenge.  The water she was sitting on earlier want up inducing vomiting.  Her husband is going to get her Sprite.  Plan is to discharge patient home after IV fluids with a prescription for Zofran if she is able to tolerate p.o. challenge in clinic.  Patient IV fluids have finished and she has not had any further emesis.  Will discharge patient home with a diagnosis of hyperemesis gravidarum.   Final Clinical Impressions(s) / UC Diagnoses   Final diagnoses:  Hyperemesis gravidarum     Discharge Instructions      Continue to take your doxylamine and B6 at bedtime for help with your nausea  associated with pregnancy.  Use the Zofran ODT every 8 hours as needed for nausea and vomiting.  Continue to sip on clear liquids which include noncaffeinated soda such as ginger ale or Sprite, Pedialyte, Gatorade, water, and broth.  If your nausea vomiting returns or does not help with the medication either return for reevaluation or go to the ER for evaluation.     ED Prescriptions     Medication Sig Dispense Auth. Provider   ondansetron (ZOFRAN-ODT) 8 MG disintegrating tablet Take 1 tablet (8 mg total) by mouth every 8 (eight) hours as needed for nausea or vomiting. 20 tablet Becky Augusta, NP      PDMP not reviewed this encounter.   Becky Augusta, NP 01/22/21 1454

## 2021-02-11 ENCOUNTER — Ambulatory Visit
Admission: RE | Admit: 2021-02-11 | Discharge: 2021-02-11 | Disposition: A | Discharge status: Home / Self Care | Payer: 59 | Source: Ambulatory Visit | Attending: Obstetrics & Gynecology | Admitting: Obstetrics & Gynecology

## 2021-02-11 ENCOUNTER — Other Ambulatory Visit: Payer: Self-pay

## 2021-02-11 DIAGNOSIS — Z3689 Encounter for other specified antenatal screening: Secondary | ICD-10-CM | POA: Diagnosis present

## 2021-02-16 NOTE — L&D Delivery Note (Addendum)
Delivery Note At 1326 a viable female was delivered via  vaginal birth (Presentation:   OA with restitution to ROT   ).  The anterior should was delivered via gentle downward guidance. A compound hand was noted. The posterior shoulder then delivered with gentle upward guidance. Spontaneous resiprations noted, and the baby was placed on the maternal abdomen for tactile stimulation and bonding. The three vessel cord was clamped and cut only once it had stopped pulsing. APGAR:8.9, ; weight pending .   Placenta status:  ,intact  .  Cord:  3 vessel  with the following complications:  none.   Anesthesia:  epidural Episiotomy:  none Lacerations:  left labial laceration. Small vaginal tear. Suture Repair: 3.0 vicryl rapide Est. Blood Loss (mL):  pending  Mom to postpartum.  Baby to Couplet care / Skin to Skin.  Mirna Mires 07/20/2021, 2:05 PM

## 2021-02-18 ENCOUNTER — Other Ambulatory Visit: Payer: Self-pay

## 2021-02-18 ENCOUNTER — Ambulatory Visit (INDEPENDENT_AMBULATORY_CARE_PROVIDER_SITE_OTHER): Payer: 59 | Admitting: Obstetrics and Gynecology

## 2021-02-18 VITALS — BP 122/80 | Wt 161.0 lb

## 2021-02-18 DIAGNOSIS — Z3480 Encounter for supervision of other normal pregnancy, unspecified trimester: Secondary | ICD-10-CM

## 2021-02-18 DIAGNOSIS — Z3A19 19 weeks gestation of pregnancy: Secondary | ICD-10-CM

## 2021-02-18 DIAGNOSIS — Z362 Encounter for other antenatal screening follow-up: Secondary | ICD-10-CM

## 2021-02-18 LAB — POCT URINALYSIS DIPSTICK OB
Glucose, UA: NEGATIVE
POC,PROTEIN,UA: NEGATIVE

## 2021-02-18 NOTE — Progress Notes (Signed)
ROB - no concerns. RM 4 °

## 2021-02-18 NOTE — Progress Notes (Signed)
Routine Prenatal Care Visit  Subjective  Kathy Kelley is a 26 y.o. G2P1001 at [redacted]w[redacted]d being seen today for ongoing prenatal care.  She is currently monitored for the following issues for this low-risk pregnancy and has Calculus of gallbladder without cholecystitis without obstruction and Supervision of other normal pregnancy, antepartum on their problem list.  ----------------------------------------------------------------------------------- Patient reports no complaints.   Contractions: Not present. Vag. Bleeding: None.  Movement: Present. Denies leaking of fluid.  ----------------------------------------------------------------------------------- The following portions of the patient's history were reviewed and updated as appropriate: allergies, current medications, past family history, past medical history, past social history, past surgical history and problem list. Problem list updated.   Objective  Blood pressure 122/80, weight 161 lb (73 kg), last menstrual period 10/06/2020. Pregravid weight 177 lb (80.3 kg) Total Weight Gain -16 lb (-7.258 kg) Urinalysis:      Fetal Status: Fetal Heart Rate (bpm): 150   Movement: Present     General:  Alert, oriented and cooperative. Patient is in no acute distress.  Skin: Skin is warm and dry. No rash noted.   Cardiovascular: Normal heart rate noted  Respiratory: Normal respiratory effort, no problems with respiration noted  Abdomen: Soft, gravid, appropriate for gestational age. Pain/Pressure: Absent     Pelvic:  Cervical exam deferred        Extremities: Normal range of motion.     ental Status: Normal mood and affect. Normal behavior. Normal judgment and thought content.    There is no immunization history on file for this patient.   Assessment   26 y.o. G2P1001 at [redacted]w[redacted]d by  07/13/2021, by Last Menstrual Period presenting for routine prenatal visit  Plan   pregnancy Problems (from 11/29/20 to present)     Problem Noted  Resolved   Supervision of other normal pregnancy, antepartum 11/29/2020 by Mirna Mires, CNM No   Overview Addendum 12/26/2020 10:58 AM by Mirna Mires, CNM     Nursing Staff Provider  Office Location  [facility] Dating  LMP = 11 week [place]  Language  English Anatomy US  Incomplete RVOT small choroid plexus cyst  Flu Vaccine   Genetic Screen  NIPS: Normal XX  TDaP vaccine    Hgb A1C or  GTT Early : N/A Third trimester :   Covid    LAB RESULTS   Rhogam  N/A Blood Type   B+  Feeding Plan  Antibody  negative  Contraception  Rubella  immune  Circumcision  RPR   NR  Pediatrician   HBsAg   neg  Support Person Ryan HIV  NR  Prenatal Classes  Varicella Non immune    GBS  (For PCN allergy, check sensitivities)   BTL Consent     VBAC Consent N/A Pap 05/03/20 NILM  Pelvis Tested  7lbs 1oz Hgb Electro      CF      SMA                   Gestational age appropriate obstetric precautions including but not limited to vaginal bleeding, contractions, leaking of fluid and fetal movement were reviewed in detail with the patient.    1) Anatomy scan  - follow up for incomplete visualization of RVOT - isolated choroid plexus cyst in setting of negative NIPT negative T18 screening no further follow up    Return in about 4 weeks (around 03/18/2021) for ROB, [facility] [place] in 4 week .  Vena Austria, MD, [facility], [facility] 02/18/2021, 8:34 AM

## 2021-02-18 NOTE — Patient Instructions (Addendum)
Overview: Isolated choroid plexus cyst(s) are common findings, seen in 1-2% of normal fetuses in the second trimester.  When choroid plexus cyst(s) are identified, an experienced provider should perform a detailed fetal anatomic survey and assess for aneuploidy risk factors.  Diagnosis/definition: A choroid plexus cyst is a small fluid-filled structure within the choroid of the lateral ventricles of the fetal brain. Sonographically, choroid plexus cysts appear as echolucent cysts within the echogenic choroid (Figure 1). A choroid plexus cyst is not considered a structural or functional brain abnormality.  Epidemiology/Incidence: Choroid plexus cysts are identified in approximately 1% to 2% of fetuses in the second trimester and they occur equally in female and female fetuses.  Risk factors/associations: The only association of some significance between an isolated choroid plexus cyst and a possible fetal problem is with trisomy 41. When a fetus is affected by trisomy 71, multiple structural anomalies are almost always evident, including structural heart defects, clenched hands, talipes deformity of the feet, growth restriction, and polyhydramnios. When a structural anomaly is present in addition to choroid plexus cysts, the probability of trisomy 18 is 37%.o In the absence of associated sonographic abnormalities, the likelihood of trisomy 3 is extremely low in otherwise low-risk pregnancies.  Management:   Screening/Work-up:    Counseling for a woman after prenatal identification of a fetal choroid plexus cyst should be guided by the presence or absence of other sonographic markers or structural abnormalities, results of maternal screening for risk of trisomy 46 (if performed), and maternal age (Figure 2).  In women who screen negative for trisomy 17 (either first- or second-trimester screening) and in whom no other fetal structural abnormalities are visualized on a detailed ultrasound, the finding of an  isolated choroid plexus cyst does not require additional genetic testing.  More than 90% of choroid plexus cysts resolve, most often by 28 weeks. Studies evaluating neurodevelopmental outcomes in euploid children born after a prenatal diagnosis of choroid plexus cysts have not shown differences in neurocognitive ability, motor function, or behavior.  Therefore, neither serial antenatal ultrasounds nor post-natal evaluation are clinically useful.

## 2021-02-20 ENCOUNTER — Other Ambulatory Visit: Payer: Self-pay | Admitting: Obstetrics & Gynecology

## 2021-02-20 DIAGNOSIS — O219 Vomiting of pregnancy, unspecified: Secondary | ICD-10-CM

## 2021-02-21 ENCOUNTER — Other Ambulatory Visit: Payer: Self-pay | Admitting: Obstetrics & Gynecology

## 2021-02-21 MED ORDER — ONDANSETRON 8 MG PO TBDP: 8.0000 mg | ORAL_TABLET | Freq: Three times a day (TID) | ORAL | 2 refills | Status: DC | PRN

## 2021-03-17 ENCOUNTER — Ambulatory Visit
Admission: RE | Admit: 2021-03-17 | Discharge: 2021-03-17 | Disposition: A | Discharge status: Home / Self Care | Payer: 59 | Source: Ambulatory Visit | Attending: Obstetrics and Gynecology | Admitting: Obstetrics and Gynecology

## 2021-03-17 ENCOUNTER — Other Ambulatory Visit: Payer: Self-pay

## 2021-03-17 DIAGNOSIS — Z362 Encounter for other antenatal screening follow-up: Secondary | ICD-10-CM | POA: Diagnosis present

## 2021-03-17 DIAGNOSIS — Z3A23 23 weeks gestation of pregnancy: Secondary | ICD-10-CM | POA: Insufficient documentation

## 2021-03-17 DIAGNOSIS — Z3480 Encounter for supervision of other normal pregnancy, unspecified trimester: Secondary | ICD-10-CM

## 2021-03-20 ENCOUNTER — Encounter: Payer: Self-pay | Admitting: Obstetrics & Gynecology

## 2021-03-20 ENCOUNTER — Ambulatory Visit (INDEPENDENT_AMBULATORY_CARE_PROVIDER_SITE_OTHER): Payer: 59 | Admitting: Obstetrics & Gynecology

## 2021-03-20 ENCOUNTER — Other Ambulatory Visit: Payer: Self-pay

## 2021-03-20 VITALS — BP 110/70 | Wt 162.0 lb

## 2021-03-20 DIAGNOSIS — Z131 Encounter for screening for diabetes mellitus: Secondary | ICD-10-CM

## 2021-03-20 DIAGNOSIS — Z3482 Encounter for supervision of other normal pregnancy, second trimester: Secondary | ICD-10-CM

## 2021-03-20 DIAGNOSIS — Z3A23 23 weeks gestation of pregnancy: Secondary | ICD-10-CM

## 2021-03-20 NOTE — Patient Instructions (Signed)

## 2021-03-20 NOTE — Progress Notes (Signed)
°  Subjective  Fetal Movement? yes Contractions? no Leaking Fluid? no Vaginal Bleeding? no  Objective  BP 110/70    Wt 162 lb (73.5 kg)    LMP 10/06/2020 (Exact Date)    BMI 29.63 kg/m  General: NAD Pumonary: no increased work of breathing Abdomen: gravid, non-tender Extremities: no edema Psychiatric: mood appropriate, affect full  Assessment  26 y.o. G2P1001 at [redacted]w[redacted]d by  07/13/2021, by Last Menstrual Period presenting for routine prenatal visit  Plan   Problem List Items Addressed This Visit      Other   Supervision of other normal pregnancy, antepartum - Primary  Other Visit Diagnoses    [redacted] weeks gestation of pregnancy       Screening for diabetes mellitus       Relevant Orders   28 Week RH+Panel    PNV, FMC, Glucola nv Plans to breast feed Declines flu shot  pregnancy Problems (from 11/29/20 to present)    Problem Noted Resolved   Supervision of other normal pregnancy, antepartum 11/29/2020 by Mirna Mires, CNM No   Overview Addendum 03/20/2021  8:25 AM by Nadara Mustard, MD     Nursing Staff Provider  Office Location  [facility] Dating  LMP = 11 weeks [place]  Language  English Anatomy US  Incomplete RVOT small choroid plexus cyst; later resolved  Flu Vaccine  Declines Genetic Screen  NIPS: Normal XXXX  TDaP vaccine    Hgb A1C or  GTT Early : N/A Third trimester :   Covid no   LAB RESULTS   Rhogam  N/A Blood Type   B+  Feeding Plan Breast Antibody  negative  Contraception  Rubella  immune  Circumcision n/a RPR   NR  Pediatrician   HBsAg   neg  Support Person Ryan HIV  NR  Prenatal Classes  Varicella Non immune    GBS  (For PCN allergy, check sensitivities)   BTL Consent no    VBAC Consent N/A Pap  05/03/20 NILM  Pelvis Tested 7lbs 1oz               Annamarie Major, MD, FACOG [facility], [facility] 03/20/2021  8:26 AM

## 2021-04-22 ENCOUNTER — Other Ambulatory Visit: Payer: Self-pay

## 2021-04-22 ENCOUNTER — Other Ambulatory Visit: Payer: 59

## 2021-04-22 ENCOUNTER — Ambulatory Visit (INDEPENDENT_AMBULATORY_CARE_PROVIDER_SITE_OTHER): Payer: 59 | Admitting: Obstetrics and Gynecology

## 2021-04-22 VITALS — BP 104/80 | Wt 159.0 lb

## 2021-04-22 DIAGNOSIS — Z3A28 28 weeks gestation of pregnancy: Secondary | ICD-10-CM

## 2021-04-22 DIAGNOSIS — Z3483 Encounter for supervision of other normal pregnancy, third trimester: Secondary | ICD-10-CM

## 2021-04-22 DIAGNOSIS — Z131 Encounter for screening for diabetes mellitus: Secondary | ICD-10-CM

## 2021-04-22 NOTE — Progress Notes (Signed)
?  Subjective  ?Fetal Movement? yes ?Contractions? Yes; B-H contrxns occas ?Leaking Fluid? no ?Vaginal Bleeding? no ?PNVs? yes ? ?Objective  ?BP 104/80   Wt 159 lb (72.1 kg)   LMP 10/06/2020 (Exact Date)   BMI 29.08 kg/m?  ?General: NAD ?Pulmonary: no increased work of breathing ?Abdomen: gravid, non-tender ?Extremities: no edema ?Psychiatric: mood appropriate, affect full ? ?Assessment  ?26 y.o. G2P1001 at [redacted]w[redacted]d by  07/13/2021, by Last Menstrual Period presenting for routine prenatal visit ? ?Plan  ? ?Problem List Items Addressed This Visit   ?None ?Visit Diagnoses   ? ? Encounter for supervision of other normal pregnancy in third trimester    -  Primary  ? [redacted] weeks gestation of pregnancy      ? ?  ? ?Doing well. Still having GERD, taking pepcid/tums without relief.  ? ?Labs: 28 wk labs today ? ? ?Kelda Azad B. Govani Radloff, PA-C ?[facility],  ?04/22/2021  9:22 AM ? ?

## 2021-04-22 NOTE — Progress Notes (Signed)
ROB- 1 hr GTT, no concerns 

## 2021-04-23 ENCOUNTER — Other Ambulatory Visit: Payer: Self-pay | Admitting: Obstetrics & Gynecology

## 2021-04-23 ENCOUNTER — Telehealth: Payer: Self-pay

## 2021-04-23 DIAGNOSIS — O99810 Abnormal glucose complicating pregnancy: Secondary | ICD-10-CM

## 2021-04-23 LAB — 28 WEEK RH+PANEL
Basophils Absolute: 0 10*3/uL (ref 0.0–0.2)
Basos: 0 %
EOS (ABSOLUTE): 0.1 10*3/uL (ref 0.0–0.4)
Eos: 1 %
Gestational Diabetes Screen: 163 mg/dL — ABNORMAL HIGH (ref 70–139)
HIV Screen 4th Generation wRfx: NONREACTIVE
Hematocrit: 34.6 % (ref 34.0–46.6)
Hemoglobin: 11.4 g/dL (ref 11.1–15.9)
Immature Grans (Abs): 0 10*3/uL (ref 0.0–0.1)
Immature Granulocytes: 1 %
Lymphocytes Absolute: 1 10*3/uL (ref 0.7–3.1)
Lymphs: 17 %
MCH: 29.2 pg (ref 26.6–33.0)
MCHC: 32.9 g/dL (ref 31.5–35.7)
MCV: 89 fL (ref 79–97)
Monocytes Absolute: 0.2 10*3/uL (ref 0.1–0.9)
Monocytes: 3 %
Neutrophils Absolute: 4.7 10*3/uL (ref 1.4–7.0)
Neutrophils: 78 %
Platelets: 207 10*3/uL (ref 150–450)
RBC: 3.91 x10E6/uL (ref 3.77–5.28)
RDW: 12 % (ref 11.7–15.4)
RPR Ser Ql: NONREACTIVE
WBC: 5.9 10*3/uL (ref 3.4–10.8)

## 2021-04-23 NOTE — Telephone Encounter (Signed)
Pt is scheduled on Monday, 3/13. ?

## 2021-04-23 NOTE — Progress Notes (Signed)
Sch 3 hour GTT plz

## 2021-04-23 NOTE — Telephone Encounter (Signed)
-----   Message from Nadara Mustard, MD sent at 04/23/2021  7:34 AM EST ----- Sch 3 hour GTT plz

## 2021-04-23 NOTE — Telephone Encounter (Signed)
Contacted patient via phone. Left voicemail for patient to call back to be scheduled 

## 2021-04-28 ENCOUNTER — Other Ambulatory Visit: Payer: 59

## 2021-04-28 ENCOUNTER — Other Ambulatory Visit: Payer: Self-pay

## 2021-04-28 DIAGNOSIS — O99810 Abnormal glucose complicating pregnancy: Secondary | ICD-10-CM

## 2021-04-29 LAB — GESTATIONAL GLUCOSE TOLERANCE
Glucose, Fasting: 76 mg/dL (ref 70–94)
Glucose, GTT - 1 Hour: 181 mg/dL — ABNORMAL HIGH (ref 70–179)
Glucose, GTT - 2 Hour: 142 mg/dL (ref 70–154)
Glucose, GTT - 3 Hour: 91 mg/dL (ref 70–139)

## 2021-05-01 ENCOUNTER — Other Ambulatory Visit: Payer: Self-pay

## 2021-05-01 ENCOUNTER — Ambulatory Visit (INDEPENDENT_AMBULATORY_CARE_PROVIDER_SITE_OTHER): Payer: 59 | Admitting: Obstetrics

## 2021-05-01 VITALS — BP 100/60 | Wt 160.0 lb

## 2021-05-01 NOTE — Progress Notes (Signed)
ROB- no concerns 

## 2021-05-01 NOTE — Progress Notes (Signed)
Routine Prenatal Care Visit ? ?Subjective  ?Kathy Kelley is a 26 y.o. G2P1001 at [redacted]w[redacted]d being seen today for ongoing prenatal care.  She is currently monitored for the following issues for this low risk pregnancy and has Calculus of gallbladder without cholecystitis without obstruction and Supervision of other normal pregnancy, antepartum on their problem list.  ?----------------------------------------------------------------------------------- ?Patient reports good fetal movement.  ? .  .   . Leaking Fluid denies.  ?----------------------------------------------------------------------------------- ?The following portions of the patient's history were reviewed and updated as appropriate: allergies, current medications, past family history, past medical history, past social history, past surgical history and problem list. Problem list updated. ? ?Objective  ?Blood pressure 100/60, weight 160 lb (72.6 kg), last menstrual period 10/06/2020. ?Pregravid weight 177 lb (80.3 kg) Total Weight Gain -17 lb (-7.711 kg) ?Urinalysis: Urine Protein    Urine Glucose   ? ?Fetal Status:          ? ?General:  Alert, oriented and cooperative. Patient is in no acute distress.  ?Skin: Skin is warm and dry. No rash noted.   ?Cardiovascular: Normal heart rate noted  ?Respiratory: Normal respiratory effort, no problems with respiration noted  ?Abdomen: Soft, gravid, appropriate for gestational age.       ?Pelvic:  Cervical exam deferred        ?Extremities: Normal range of motion.     ?Mental Status: Normal mood and affect. Normal behavior. Normal judgment and thought content.  ? ?Assessment  ? ?26 y.o. G2P1001 at [redacted]w[redacted]d by  07/13/2021, by Last Menstrual Period presenting for routine prenatal visit ? ?Plan  ? ?pregnancy Problems (from 11/29/20 to present)   ? Problem Noted Resolved  ? Supervision of other normal pregnancy, antepartum 11/29/2020 by Mirna Mires, CNM No  ? Overview Addendum 03/20/2021  8:25 AM by Nadara Mustard, MD   ?   ?Nursing Staff Provider  ?Office Location  [facility] Dating  LMP = 11 weeks [place]  ?Language  English Anatomy US  Incomplete RVOT small choroid plexus cyst; later resolved  ?Flu Vaccine  Declines Genetic Screen  NIPS: Normal XXXX  ?TDaP vaccine    Hgb A1C or  ?GTT Early : N/A ?Third trimester :   ?Covid no   LAB RESULTS   ?Rhogam  N/A Blood Type   B+  ?Feeding Plan Breast Antibody  negative  ?Contraception  Rubella  immune  ?Circumcision n/a RPR   NR  ?Pediatrician   HBsAg   neg  ?Support Person Ryan HIV  NR  ?Prenatal Classes  Varicella Non immune  ?  GBS  (For PCN allergy, check sensitivities)   ?BTL Consent no    ?VBAC Consent N/A Pap  05/03/20 NILM  ?Pelvis Tested 7lbs 1oz    ? ? ?  ?  ?  ?  ? ?Term labor symptoms and general obstetric precautions including but not limited to vaginal bleeding, contractions, leaking of fluid and fetal movement were reviewed in detail with the patient. ?Please refer to After Visit Summary for other counseling recommendations.  ? ?Plan to administer Tdap next visit.  ? ?Return in about 2 weeks (around 05/15/2021) for return OB. ? ?Lamont Snowball, SNM ?Mirna Mires, CNM  ?05/01/2021 9:55 AM   ? ?

## 2021-05-15 ENCOUNTER — Ambulatory Visit (INDEPENDENT_AMBULATORY_CARE_PROVIDER_SITE_OTHER): Payer: 59 | Admitting: Obstetrics

## 2021-05-15 VITALS — BP 122/70 | Wt 161.0 lb

## 2021-05-15 DIAGNOSIS — Z23 Encounter for immunization: Secondary | ICD-10-CM | POA: Diagnosis not present

## 2021-05-15 DIAGNOSIS — Z3483 Encounter for supervision of other normal pregnancy, third trimester: Secondary | ICD-10-CM | POA: Diagnosis not present

## 2021-05-15 DIAGNOSIS — Z3480 Encounter for supervision of other normal pregnancy, unspecified trimester: Secondary | ICD-10-CM

## 2021-05-15 DIAGNOSIS — Z3A31 31 weeks gestation of pregnancy: Secondary | ICD-10-CM

## 2021-05-15 LAB — POCT URINALYSIS DIPSTICK OB
Glucose, UA: NEGATIVE
POC,PROTEIN,UA: NEGATIVE

## 2021-05-15 NOTE — Progress Notes (Signed)
TDAP today. No vb. No lof.  °

## 2021-05-15 NOTE — Addendum Note (Signed)
Addended by: Cleophas Dunker D on: 05/15/2021 10:23 AM ? ? Modules accepted: Orders ? ?

## 2021-05-15 NOTE — Addendum Note (Signed)
Addended by: Liliane Shi on: 05/15/2021 11:00 AM ? ? Modules accepted: Orders ? ?

## 2021-05-15 NOTE — Progress Notes (Signed)
Routine Prenatal Care Visit ? ?Subjective  ?Kathy Kelley is a 26 y.o. G2P1001 at [redacted]w[redacted]d being seen today for ongoing prenatal care.  She is currently monitored for the following issues for this low-risk pregnancy and has Calculus of gallbladder without cholecystitis without obstruction and Supervision of other normal pregnancy, antepartum on their problem list.  ?----------------------------------------------------------------------------------- ?Patient reports no complaints.  She has named her son Zenda Alpers. ?Contractions: Not present. Vag. Bleeding: None.  Movement: Present. Leaking Fluid denies.  ?----------------------------------------------------------------------------------- ?The following portions of the patient's history were reviewed and updated as appropriate: allergies, current medications, past family history, past medical history, past social history, past surgical history and problem list. Problem list updated. ? ?Objective  ?Blood pressure 122/70, weight 161 lb (73 kg), last menstrual period 10/06/2020. ?Pregravid weight 177 lb (80.3 kg) Total Weight Gain -16 lb (-7.258 kg) ?Urinalysis: Urine Protein    Urine Glucose   ? ?Fetal Status:     Movement: Present    ? ?General:  Alert, oriented and cooperative. Patient is in no acute distress.  ?Skin: Skin is warm and dry. No rash noted.   ?Cardiovascular: Normal heart rate noted  ?Respiratory: Normal respiratory effort, no problems with respiration noted  ?Abdomen: Soft, gravid, appropriate for gestational age. Pain/Pressure: Present     ?Pelvic:  Cervical exam deferred        ?Extremities: Normal range of motion.     ?Mental Status: Normal mood and affect. Normal behavior. Normal judgment and thought content.  ? ?Assessment  ? ?26 y.o. G2P1001 at [redacted]w[redacted]d by  07/13/2021, by Last Menstrual Period presenting for routine prenatal visit ? ?Plan  ? ?pregnancy Problems (from 11/29/20 to present)   ? Problem Noted Resolved  ? Supervision of other normal  pregnancy, antepartum 11/29/2020 by Mirna Mires, CNM No  ? Overview Addendum 05/01/2021 10:13 AM by Mirna Mires, CNM  ?   ?Nursing Staff Provider  ?Office Location  [facility] Dating  LMP = 11 weeks [place]  ?Language  English Anatomy US  Incomplete RVOT small choroid plexus cyst; later resolved  ?Flu Vaccine  Declines Genetic Screen  NIPS: Normal XXXX  ?TDaP vaccine    Hgb A1C or  ?GTT Early : N/A ?Third trimester :   ?Covid no   LAB RESULTS   ?Rhogam  N/A Blood Type   B+  ?Feeding Plan Breast Antibody  negative  ?Contraception  IUD Rubella  immune  ?Circumcision n/a RPR   NR  ?Pediatrician   HBsAg   neg  ?Support Person Ryan HIV  NR  ?Prenatal Classes  Varicella Non immune  ?  GBS  (For PCN allergy, check sensitivities)   ?BTL Consent no    ?VBAC Consent N/A Pap  05/03/20 NILM  ?Pelvis Tested 7lbs 1oz    ? ? ?  ?  ?  ?  ? ?Preterm labor symptoms and general obstetric precautions including but not limited to vaginal bleeding, contractions, leaking of fluid and fetal movement were reviewed in detail with the patient. ?Please refer to After Visit Summary for other counseling recommendations.  ? ?No follow-ups on file. ? ?Mirna Mires, CNM  ?05/15/2021 9:56 AM   ? ?

## 2021-05-27 ENCOUNTER — Ambulatory Visit (INDEPENDENT_AMBULATORY_CARE_PROVIDER_SITE_OTHER): Payer: 59 | Admitting: Obstetrics

## 2021-05-27 ENCOUNTER — Encounter: Payer: 59 | Admitting: Advanced Practice Midwife

## 2021-05-27 VITALS — BP 112/72 | Wt 160.0 lb

## 2021-05-27 DIAGNOSIS — Z3480 Encounter for supervision of other normal pregnancy, unspecified trimester: Secondary | ICD-10-CM

## 2021-05-27 DIAGNOSIS — Z3A33 33 weeks gestation of pregnancy: Secondary | ICD-10-CM

## 2021-05-27 LAB — POCT URINALYSIS DIPSTICK OB
Glucose, UA: NEGATIVE
POC,PROTEIN,UA: NEGATIVE

## 2021-05-27 NOTE — Patient Instructions (Signed)
34 Week Obstetrics Instructions  PRE-REGISTRATION  Please plan to pre-register at the hospital during the next week.  You can complete the form only by visiting [email or web address]   If you have any questions concerning this please contact the main number at [facility] [phone]  SIGNS OF LABOR  There is never an absolute way for an individual to determine if they ar truly in labor unless they are seen in the office or at the hospital.  However, please follow these guidelines in order to determine if you may potentially be in labor:  A.  Contractions 5 minutes apart for an hour (first baby)  B. Contractions 5 minutes apart for 20-30 minutes (if you have had a baby)  C. If you have any doubts   -If A, B, or C occur, please go to the hospital to be seen in Labor & Delivery [facility] [phone]-the nurse will check you and contact us. Please report to the hospital through the Emergency Room entrance and they will transport you to labor and delivery.  SPONTANEOUS RUPTURE OF MEMBRANES ("Water Breaks")  This is usually evidenced by a gush of fluid from the vagina.  If this occurs during office hours (and you are uncertain if your water has just broken), schedule ana appointment to come into the office to be checked.  After office hours and on weekends, go to the labor and delivery unit where the nurse will check you and contact us.  VAGINAL BLEEDING  A.  "Bloody Show" - this is a thick blood streaked vaginal discharge.  It is normal and should cause no alarm.  B. Heavy vaginal bleeding (similar to that of a period or heavier).  If this occurs, you should go IMMEDIATELY to the labor and delivery unit.  WHEN TO CALL  1. Routine Questions: please make a list of questions you may have and merely ask them at your next visit in the office.  2. Urgent or Worrisome Questions:  During office hours, please  call us at [phone] to leave a message with the nurse and/or provider for assistance.  After hours, the answering system will direct you as to which provider is on call and if your concern requires that they be paged.  If so, follow the instructions on the voicemail and the provider will contact you back at their earliest convenience.  3. Labor or Rupture of Membranes:  This will require an examination.  Please contact the office to schedule an appointment for exam.  After office hours and on the weekends, go to the Labor and Delivery unit at the hospital where the nurse will check you and call us.  You do not need to notify us at night that you are coming.  INSURANCE REGULATIONS/HOSPITAL DISCHARGE It is very important that you understand that not all delivers are the same.  Typically, a vaginal delivery requires a 2 day stay at the hospital and a cesarean delivery requires 4 days in the hospital for recovery.  It is your responsibility to know what time frame your insurance suggests you stay in the hospital for coverage purposes.  However, one must keep in mind that providers use their own judgment and discretion when determining when an individual is able to be discharged.    

## 2021-05-27 NOTE — Progress Notes (Signed)
Routine Prenatal Care Visit ? ?Subjective  ?Kathy Kelley is a 26 y.o. G2P1001 at [redacted]w[redacted]d being seen today for ongoing prenatal care.  She is currently monitored for the following issues for this low-risk pregnancy and has Calculus of gallbladder without cholecystitis without obstruction and Supervision of other normal pregnancy, antepartum on their problem list.  ?----------------------------------------------------------------------------------- ?Patient reports no complaints.   ?Contractions: Not present. Vag. Bleeding: None.  Movement: Present. Leaking Fluid denies.  ?----------------------------------------------------------------------------------- ?The following portions of the patient's history were reviewed and updated as appropriate: allergies, current medications, past family history, past medical history, past social history, past surgical history and problem list. Problem list updated. ? ?Objective  ?Blood pressure 112/72, weight 160 lb (72.6 kg), last menstrual period 10/06/2020. ?Pregravid weight 177 lb (80.3 kg) Total Weight Gain -17 lb (-7.711 kg) ?Urinalysis: Urine Protein    Urine Glucose   ? ?Fetal Status:     Movement: Present    ? ?General:  Alert, oriented and cooperative. Patient is in no acute distress.  ?Skin: Skin is warm and dry. No rash noted.   ?Cardiovascular: Normal heart rate noted  ?Respiratory: Normal respiratory effort, no problems with respiration noted  ?Abdomen: Soft, gravid, appropriate for gestational age. Pain/Pressure: Absent     ?Pelvic:  Cervical exam deferred        ?Extremities: Normal range of motion.     ?Mental Status: Normal mood and affect. Normal behavior. Normal judgment and thought content.  ? ?Assessment  ? ?26 y.o. G2P1001 at [redacted]w[redacted]d by  07/13/2021, by Last Menstrual Period presenting for routine prenatal visit ? ?Plan  ? ?pregnancy Problems (from 11/29/20 to present)   ? Problem Noted Resolved  ? Supervision of other normal pregnancy, antepartum 11/29/2020 by  Mirna Mires, CNM No  ? Overview Addendum 05/15/2021 10:13 AM by Mirna Mires, CNM  ?   ?Nursing Staff Provider  ?Office Location  [facility] Dating  LMP = 11 weeks [place]  ?Language  English Anatomy US  Incomplete RVOT small choroid plexus cyst; later resolved  ?Flu Vaccine  Declines Genetic Screen  NIPS: Normal XXXX  ?TDaP vaccine    Hgb A1C or  ?GTT Early : N/A ?Third trimester :   ?Covid no   LAB RESULTS   ?Rhogam  N/A Blood Type B/Positive/-- (11/08 7416) B+  ?Feeding Plan Breast Antibody Negative (11/08 0956)negative  ?Contraception  IUD Rubella 4.31 (11/08 0956)immune  ?Circumcision n/a RPR Non Reactive (03/07 0948) NR  ?Pediatrician   HBsAg Negative (11/08 0956) neg  ?Support Person Ryan HIV Non Reactive (03/07 0948)NR  ?Prenatal Classes  Varicella Non immune  ?  GBS  (For PCN allergy, check sensitivities)   ?BTL Consent no    ?VBAC Consent N/A Pap  05/03/20 NILM  ?Pelvis Tested 7lbs 1oz    ? ? ?  ?  ?  ?  ? ?Preterm labor symptoms and general obstetric precautions including but not limited to vaginal bleeding, contractions, leaking of fluid and fetal movement were reviewed in detail with the patient. ?Please refer to After Visit Summary for other counseling recommendations.  ? ?Return in about 2 weeks (around 06/10/2021) for return OB. ? ?Mirna Mires, CNM  ?05/27/2021 9:39 AM   ? ?

## 2021-06-10 ENCOUNTER — Ambulatory Visit (INDEPENDENT_AMBULATORY_CARE_PROVIDER_SITE_OTHER): Payer: 59 | Admitting: Obstetrics

## 2021-06-10 VITALS — BP 120/70 | Wt 160.0 lb

## 2021-06-10 DIAGNOSIS — Z3480 Encounter for supervision of other normal pregnancy, unspecified trimester: Secondary | ICD-10-CM

## 2021-06-10 DIAGNOSIS — Z3A35 35 weeks gestation of pregnancy: Secondary | ICD-10-CM

## 2021-06-10 NOTE — Progress Notes (Signed)
Routine Prenatal Care Visit ? ?Subjective  ?Kathy Kelley is a 26 y.o. G2P1001 at [redacted]w[redacted]d being seen today for ongoing prenatal care.  She is currently monitored for the following issues for this high-risk pregnancy and has Calculus of gallbladder without cholecystitis without obstruction and Supervision of other normal pregnancy, antepartum on their problem list.  ?----------------------------------------------------------------------------------- ?Patient reports no complaints.   ?Contractions: Not present. Vag. Bleeding: None.  Movement: Present. Leaking Fluid denies.  ?----------------------------------------------------------------------------------- ?The following portions of the patient's history were reviewed and updated as appropriate: allergies, current medications, past family history, past medical history, past social history, past surgical history and problem list. Problem list updated. ? ?Objective  ?Blood pressure 120/70, weight 160 lb (72.6 kg), last menstrual period 10/06/2020. ?Pregravid weight 177 lb (80.3 kg) Total Weight Gain -17 lb (-7.711 kg) ?Urinalysis: Urine Protein    Urine Glucose   ? ?Fetal Status:     Movement: Present    ? ?General:  Alert, oriented and cooperative. Patient is in no acute distress.  ?Skin: Skin is warm and dry. No rash noted.   ?Cardiovascular: Normal heart rate noted  ?Respiratory: Normal respiratory effort, no problems with respiration noted  ?Abdomen: Soft, gravid, appropriate for gestational age. Pain/Pressure: Present     ?Pelvic:  Cervical exam deferred        ?Extremities: Normal range of motion.     ?Mental Status: Normal mood and affect. Normal behavior. Normal judgment and thought content.  ? ?Assessment  ? ?26 y.o. G2P1001 at [redacted]w[redacted]d by  07/13/2021, by Last Menstrual Period presenting for routine prenatal visit ? ?Plan  ? ?pregnancy Problems (from 11/29/20 to present)   ? Problem Noted Resolved  ? Supervision of other normal pregnancy, antepartum 11/29/2020 by  Imagene Riches, CNM No  ? Overview Addendum 05/15/2021 10:13 AM by Imagene Riches, CNM  ?   ?Nursing Staff Provider  ?Office Location  [facility] Dating  LMP = 11 weeks [place]  ?Language  English Anatomy US  Incomplete RVOT small choroid plexus cyst; later resolved  ?Flu Vaccine  Declines Genetic Screen  NIPS: Normal XXXX  ?TDaP vaccine    Hgb A1C or  ?GTT Early : N/A ?Third trimester :   ?Covid no   LAB RESULTS   ?Rhogam  N/A Blood Type B/Positive/-- (11/08 [id number]) B+  ?Feeding Plan Breast Antibody Negative (11/08 0956)negative  ?Contraception  IUD Rubella 4.31 (11/08 0956)immune  ?Circumcision n/a RPR Non Reactive (03/07 0948) NR  ?Pediatrician   HBsAg Negative (11/08 0956) neg  ?Support Person Ryan HIV Non Reactive (03/07 0948)NR  ?Prenatal Classes  Varicella Non immune  ?  GBS  (For PCN allergy, check sensitivities)   ?BTL Consent no    ?VBAC Consent N/A Pap  05/03/20 NILM  ?Pelvis Tested 7lbs 1oz    ? ? ?  ?  ?  ?  ? ?Preterm labor symptoms and general obstetric precautions including but not limited to vaginal bleeding, contractions, leaking of fluid and fetal movement were reviewed in detail with the patient. ?Please refer to After Visit Summary for other counseling recommendations.  ?She will have her GBS and cultures next visit. ? ?No follow-ups on file. ? ?Imagene Riches, CNM  ?06/10/2021 10:35 AM   ?

## 2021-06-23 ENCOUNTER — Encounter: Payer: Self-pay | Admitting: Family Medicine

## 2021-06-23 ENCOUNTER — Other Ambulatory Visit (HOSPITAL_COMMUNITY)
Admission: RE | Admit: 2021-06-23 | Discharge: 2021-06-23 | Disposition: A | Discharge status: Home / Self Care | Payer: 59 | Source: Ambulatory Visit | Attending: Licensed Practical Nurse | Admitting: Licensed Practical Nurse

## 2021-06-23 ENCOUNTER — Ambulatory Visit (INDEPENDENT_AMBULATORY_CARE_PROVIDER_SITE_OTHER): Payer: 59 | Admitting: Family Medicine

## 2021-06-23 ENCOUNTER — Encounter: Payer: 59 | Admitting: Licensed Practical Nurse

## 2021-06-23 VITALS — BP 120/62 | Wt 157.0 lb

## 2021-06-23 DIAGNOSIS — K8020 Calculus of gallbladder without cholecystitis without obstruction: Secondary | ICD-10-CM

## 2021-06-23 DIAGNOSIS — Z3A37 37 weeks gestation of pregnancy: Secondary | ICD-10-CM | POA: Diagnosis present

## 2021-06-23 DIAGNOSIS — Z3480 Encounter for supervision of other normal pregnancy, unspecified trimester: Secondary | ICD-10-CM | POA: Insufficient documentation

## 2021-06-23 DIAGNOSIS — O99810 Abnormal glucose complicating pregnancy: Secondary | ICD-10-CM | POA: Insufficient documentation

## 2021-06-23 DIAGNOSIS — Q600 Renal agenesis, unilateral: Secondary | ICD-10-CM

## 2021-06-23 LAB — POCT URINALYSIS DIPSTICK OB
Glucose, UA: NEGATIVE
POC,PROTEIN,UA: NEGATIVE

## 2021-06-23 NOTE — Progress Notes (Signed)
? ? ?  PRENATAL VISIT NOTE ? ?Subjective:  ?Kathy Kelley is a 26 y.o. G2P1001 at [redacted]w[redacted]d being seen today for ongoing prenatal care.  She is currently monitored for the following issues for this low-risk pregnancy and has Kidney congenitally absent, right; Calculus of gallbladder without cholecystitis without obstruction; Supervision of other normal pregnancy, antepartum; and Abnormal glucose tolerance test (GTT) during pregnancy, antepartum on their problem list. ? ?Patient reports no complaints.  Contractions: Not present. Vag. Bleeding: None.  Movement: Present. Denies leaking of fluid.  ? ?The following portions of the patient's history were reviewed and updated as appropriate: allergies, current medications, past family history, past medical history, past social history, past surgical history and problem list.  ? ?Objective:  ? ?Vitals:  ? 06/23/21 0829  ?BP: 120/62  ?Weight: 157 lb (71.2 kg)  ? ?Fetal Status: Fetal Heart Rate (bpm): 140 Fundal Height: 37 cm Movement: Present    ? ?General:  Alert, oriented and cooperative. Patient is in no acute distress.  ?Skin: Skin is warm and dry. No rash noted.   ?Cardiovascular: Normal heart rate noted  ?Respiratory: Normal respiratory effort, no problems with respiration noted  ?Abdomen: Soft, gravid, appropriate for gestational age.  Pain/Pressure: Present     ?Pelvic: Cervical exam deferred        ?Extremities: Normal range of motion.  Edema: None  ?Mental Status: Normal mood and affect. Normal behavior. Normal judgment and thought content.  ? ?Assessment and Plan:  ?Pregnancy: G2P1001 at [redacted]w[redacted]d ?1. Calculus of gallbladder without cholecystitis without obstruction ? ?2. Supervision of other normal pregnancy, antepartum ?Up to date  ?TWG=-20 lb (-9.072 kg) ?Patient with weight loss in pregnancy -- recommended regular eating q 2-3 hours ?Confirmed VTX by BSUS today ?Desires IUD for contraception ? ?3. Abnormal glucose tolerance test (GTT) during pregnancy,  antepartum ?FH is appropriate ?1hr of 3hr test was 181.  ? ? ?Preterm labor symptoms and general obstetric precautions including but not limited to vaginal bleeding, contractions, leaking of fluid and fetal movement were reviewed in detail with the patient. ?Please refer to After Visit Summary for other counseling recommendations.  ? ?Return in about 1 week (around 06/30/2021) for Routine prenatal care. ? ?Future Appointments  ?Date Time Provider Department Center  ?07/02/2021  9:35 AM Constant, Gigi Gin, MD [facility] None  ? ? ?Federico Flake, MD ?

## 2021-06-24 LAB — COMPREHENSIVE METABOLIC PANEL
ALT: 14 IU/L (ref 0–32)
AST: 19 IU/L (ref 0–40)
Albumin/Globulin Ratio: 1.2 (ref 1.2–2.2)
Albumin: 3.3 g/dL — ABNORMAL LOW (ref 3.9–5.0)
Alkaline Phosphatase: 203 IU/L — ABNORMAL HIGH (ref 44–121)
BUN/Creatinine Ratio: 5 — ABNORMAL LOW (ref 9–23)
BUN: 3 mg/dL — ABNORMAL LOW (ref 6–20)
Bilirubin Total: 0.3 mg/dL (ref 0.0–1.2)
CO2: 19 mmol/L — ABNORMAL LOW (ref 20–29)
Calcium: 8.8 mg/dL (ref 8.7–10.2)
Chloride: 103 mmol/L (ref 96–106)
Creatinine, Ser: 0.65 mg/dL (ref 0.57–1.00)
Globulin, Total: 2.7 g/dL (ref 1.5–4.5)
Glucose: 103 mg/dL — ABNORMAL HIGH (ref 70–99)
Potassium: 4 mmol/L (ref 3.5–5.2)
Sodium: 138 mmol/L (ref 134–144)
Total Protein: 6 g/dL (ref 6.0–8.5)
eGFR: 127 mL/min/{1.73_m2} (ref >59)

## 2021-06-24 LAB — CERVICOVAGINAL ANCILLARY ONLY
Chlamydia: NEGATIVE
Comment: NEGATIVE
Comment: NORMAL
Neisseria Gonorrhea: NEGATIVE

## 2021-06-25 ENCOUNTER — Encounter: Payer: Self-pay | Admitting: Family Medicine

## 2021-06-25 ENCOUNTER — Other Ambulatory Visit: Payer: Self-pay | Admitting: Licensed Practical Nurse

## 2021-06-25 MED ORDER — ONDANSETRON 8 MG PO TBDP
8.0000 mg | ORAL_TABLET | Freq: Three times a day (TID) | ORAL | 2 refills | Status: DC | PRN
Start: 2021-06-25 — End: 2021-07-21

## 2021-06-25 NOTE — Progress Notes (Signed)
Pt requested refill for Zofran d/t nausea. Prescription reordered. ?Roberto Scales, CNM  ?Mosetta Pigeon, [place]  ?06/25/21  ?9:44 AM  ? ?

## 2021-06-26 ENCOUNTER — Encounter: Payer: 59 | Admitting: Advanced Practice Midwife

## 2021-06-27 LAB — CULTURE, BETA STREP (GROUP B ONLY): Strep Gp B Culture: NEGATIVE

## 2021-07-02 ENCOUNTER — Encounter: Payer: Self-pay | Admitting: Obstetrics and Gynecology

## 2021-07-02 ENCOUNTER — Ambulatory Visit (INDEPENDENT_AMBULATORY_CARE_PROVIDER_SITE_OTHER): Payer: 59 | Admitting: Obstetrics and Gynecology

## 2021-07-02 VITALS — BP 120/70 | Wt 156.0 lb

## 2021-07-02 DIAGNOSIS — Z3480 Encounter for supervision of other normal pregnancy, unspecified trimester: Secondary | ICD-10-CM

## 2021-07-02 LAB — POCT URINALYSIS DIPSTICK OB
Glucose, UA: NEGATIVE
POC,PROTEIN,UA: NEGATIVE

## 2021-07-02 NOTE — Progress Notes (Signed)
? ?  PRENATAL VISIT NOTE ? ?Subjective:  ?Kathy Kelley is a 26 y.o. G2P1001 at [redacted]w[redacted]d being seen today for ongoing prenatal care.  She is currently monitored for the following issues for this low-risk pregnancy and has Kidney congenitally absent, right; Calculus of gallbladder without cholecystitis without obstruction; Supervision of other normal pregnancy, antepartum; and Abnormal glucose tolerance test (GTT) during pregnancy, antepartum on their problem list. ? ?Patient reports no complaints.  Contractions: Irritability. Vag. Bleeding: None.  Movement: Present. Denies leaking of fluid.  ? ?The following portions of the patient's history were reviewed and updated as appropriate: allergies, current medications, past family history, past medical history, past social history, past surgical history and problem list.  ? ?Objective:  ? ?Vitals:  ? 07/02/21 0930  ?BP: 120/70  ?Weight: 156 lb (70.8 kg)  ? ? ?Fetal Status: Fetal Heart Rate (bpm): 140 Fundal Height: 38 cm Movement: Present    ? ?General:  Alert, oriented and cooperative. Patient is in no acute distress.  ?Skin: Skin is warm and dry. No rash noted.   ?Cardiovascular: Normal heart rate noted  ?Respiratory: Normal respiratory effort, no problems with respiration noted  ?Abdomen: Soft, gravid, appropriate for gestational age.  Pain/Pressure: Present     ?Pelvic: Cervical exam deferred        ?Extremities: Normal range of motion.     ?Mental Status: Normal mood and affect. Normal behavior. Normal judgment and thought content.  ? ?Assessment and Plan:  ?Pregnancy: G2P1001 at [redacted]w[redacted]d ?1. Supervision of other normal pregnancy, antepartum ?Patient is doing well without complaints ?Plans IUD for contraception ? ?Term labor symptoms and general obstetric precautions including but not limited to vaginal bleeding, contractions, leaking of fluid and fetal movement were reviewed in detail with the patient. ?Please refer to After Visit Summary for other counseling  recommendations.  ? ?Return in about 1 week (around 07/09/2021) for in person, ROB, Low risk. ? ?No future appointments. ? ?Catalina Antigua, MD ? ?

## 2021-07-02 NOTE — Addendum Note (Signed)
Addended by: Quintella Baton D on: 07/02/2021 09:49 AM ? ? Modules accepted: Orders ? ?

## 2021-07-08 ENCOUNTER — Ambulatory Visit (INDEPENDENT_AMBULATORY_CARE_PROVIDER_SITE_OTHER): Payer: 59 | Admitting: Family Medicine

## 2021-07-08 VITALS — BP 120/60 | Wt 155.0 lb

## 2021-07-08 DIAGNOSIS — Z3480 Encounter for supervision of other normal pregnancy, unspecified trimester: Secondary | ICD-10-CM

## 2021-07-08 DIAGNOSIS — Z3A39 39 weeks gestation of pregnancy: Secondary | ICD-10-CM

## 2021-07-08 LAB — POCT URINALYSIS DIPSTICK OB
Glucose, UA: NEGATIVE
POC,PROTEIN,UA: NEGATIVE

## 2021-07-08 NOTE — Progress Notes (Signed)
   PRENATAL VISIT NOTE  Subjective:  Kathy Kelley is a 26 y.o. G2P1001 at [redacted]w[redacted]d being seen today for ongoing prenatal care.  She is currently monitored for the following issues for this low-risk pregnancy and has Kidney congenitally absent, right; Calculus of gallbladder without cholecystitis without obstruction; Supervision of other normal pregnancy, antepartum; and Abnormal glucose tolerance test (GTT) during pregnancy, antepartum on their problem list.  Patient reports no complaints.  Contractions: Irritability. Vag. Bleeding: None.  Movement: Present. Denies leaking of fluid.   The following portions of the patient's history were reviewed and updated as appropriate: allergies, current medications, past family history, past medical history, past social history, past surgical history and problem list.   Objective:   Vitals:   07/08/21 1330  BP: 120/60  Weight: 155 lb (70.3 kg)    Fetal Status: Fetal Heart Rate (bpm): 145 Fundal Height: 39 cm Movement: Present  Presentation: Vertex  General:  Alert, oriented and cooperative. Patient is in no acute distress.  Skin: Skin is warm and dry. No rash noted.   Cardiovascular: Normal heart rate noted  Respiratory: Normal respiratory effort, no problems with respiration noted  Abdomen: Soft, gravid, appropriate for gestational age.  Pain/Pressure: Present     Pelvic: Cervical exam performed in the presence of a chaperone Dilation: 3 Effacement (%): 50 Station: -3  Extremities: Normal range of motion.  Edema: None  Mental Status: Normal mood and affect. Normal behavior. Normal judgment and thought content.   Assessment and Plan:  Pregnancy: G2P1001 at [redacted]w[redacted]d 1. Supervision of other normal pregnancy, antepartum Favorable cervix today Declined sweeping membranes Ready for baby at home No concerns today Recommended labor stations and waiting for ctx to be every 5 min for 1 hr and coming to hospital, or if very intense before. - POC  Urinalysis Dipstick OB  2. [redacted] weeks gestation of pregnancy - POC Urinalysis Dipstick OB  Preterm labor symptoms and general obstetric precautions including but not limited to vaginal bleeding, contractions, leaking of fluid and fetal movement were reviewed in detail with the patient. Please refer to After Visit Summary for other counseling recommendations.   Return in about 1 week (around 07/15/2021) for Routine prenatal care.  Future Appointments  Date Time Provider Department Center  07/15/2021  9:35 AM Dominic, Courtney Heys, CNM [facility] None    Federico Flake, MD

## 2021-07-15 ENCOUNTER — Ambulatory Visit (INDEPENDENT_AMBULATORY_CARE_PROVIDER_SITE_OTHER): Payer: 59 | Admitting: Licensed Practical Nurse

## 2021-07-15 VITALS — BP 120/72 | Wt 154.0 lb

## 2021-07-15 DIAGNOSIS — Z3480 Encounter for supervision of other normal pregnancy, unspecified trimester: Secondary | ICD-10-CM

## 2021-07-15 DIAGNOSIS — Z3A40 40 weeks gestation of pregnancy: Secondary | ICD-10-CM

## 2021-07-15 LAB — POCT URINALYSIS DIPSTICK OB
Glucose, UA: NEGATIVE
POC,PROTEIN,UA: NEGATIVE

## 2021-07-15 NOTE — Progress Notes (Unsigned)
Routine Prenatal Care Visit  Subjective  Kathy Kelley is a 26 y.o. G2P1001 at [redacted]w[redacted]d being seen today for ongoing prenatal care.  She is currently monitored for the following issues for this {Blank single:19197::"high-risk","low-risk"} pregnancy and has Kidney congenitally absent, right; Calculus of gallbladder without cholecystitis without obstruction; Supervision of other normal pregnancy, antepartum; and Abnormal glucose tolerance test (GTT) during pregnancy, antepartum on their problem list.  ----------------------------------------------------------------------------------- Patient reports {sx:14538}.   Contractions: Irritability. Vag. Bleeding: None.  Movement: Present. Leaking Fluid {Actions; denies/reports/admits to:19208}.  ----------------------------------------------------------------------------------- The following portions of the patient's history were reviewed and updated as appropriate: allergies, current medications, past family history, past medical history, past social history, past surgical history and problem list. Problem list updated.  Objective  Blood pressure 120/72, weight 154 lb (69.9 kg), last menstrual period 10/06/2020. Pregravid weight 177 lb (80.3 kg) Total Weight Gain -23 lb (-10.4 kg) Urinalysis: Urine Protein    Urine Glucose    Fetal Status:     Movement: Present     General:  Alert, oriented and cooperative. Patient is in no acute distress.  Skin: Skin is warm and dry. No rash noted.   Cardiovascular: Normal heart rate noted  Respiratory: Normal respiratory effort, no problems with respiration noted  Abdomen: Soft, gravid, appropriate for gestational age. Pain/Pressure: Present     Pelvic:  {Blank single:19197::"Cervical exam performed","Cervical exam deferred"}        Extremities: Normal range of motion.     Mental Status: Normal mood and affect. Normal behavior. Normal judgment and thought content.   Assessment   26 y.o. G2P1001 at [redacted]w[redacted]d by   07/13/2021, by Last Menstrual Period presenting for {Blank single:19197::"routine","work-in"} prenatal visit  Plan   pregnancy Problems (from 11/29/20 to present)    Problem Noted Resolved   Abnormal glucose tolerance test (GTT) during pregnancy, antepartum 06/23/2021 by Caren Macadam, MD No   Overview Addendum 06/23/2021  8:43 AM by Caren Macadam, MD    1 hr 163, Equivocal  76. 181, 142, 91--> if FH is > than expected get growth [place]      Supervision of other normal pregnancy, antepartum 11/29/2020 by Imagene Riches, CNM No   Overview Addendum 07/15/2021 10:08 AM by [place] Derry, Regan Staff Provider  Office Location  [facility] Dating  LMP = 11 weeks [place]  Language  English Anatomy US  Incomplete RVOT small choroid plexus cyst; later resolved  Flu Vaccine  Declines Genetic Screen  NIPS: Normal XX- sawyer  TDaP vaccine    Hgb A1C or  GTT Early : N/A Third trimester :  163-->  3hr (76, 181, 142,   Covid no   LAB RESULTS   Rhogam  N/A Blood Type B/Positive/-- (11/08 0956) B+  Feeding Plan Breast Antibody Negative (11/08 0956)negative  Contraception  IUD Rubella 4.31 (11/08 0956)immune  Circumcision n/a RPR Non Reactive (03/07 0948) NR  Pediatrician   HBsAg Negative (11/08 0956) neg  Support Person Ryan HIV Non Reactive (03/07 0948)NR  Prenatal Classes  Varicella Non immune    GBS Negative/-- (05/08 0900)(For PCN allergy, check sensitivities)   BTL Consent no    VBAC Consent N/A Pap  05/03/20 NILM  Pelvis Tested 7lbs 1oz               {Blank single:19197::"Term","Preterm"} labor symptoms and general obstetric precautions including but not limited to vaginal bleeding, contractions, leaking of fluid and fetal movement were reviewed in detail with the  patient. Please refer to After Visit Summary for other counseling recommendations.   No follow-ups on file.  @MYSIGNATURE @

## 2021-07-16 NOTE — Progress Notes (Signed)
OB History & Physical   History of Present Illness:  Chief Complaint:   HPI:  Kathy Kelley is a 26 y.o. G50P1001 female at [redacted]w[redacted]d dated by L and 11wkUS.  She presents to L&D for a postdates induction. She had early and regular prenatal care.  Her pregnancy has been uncomplicated.   +FM, occasional  CTX, no LOF, no VB  Pregnancy Issues: 1.Prepregnancy  BMI 32 2. Congenitally absent right kidney   Maternal Medical History:   Past Medical History:  Diagnosis Date  . Cholelithiasis 05/2014  . 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 Vaginal delivery 04-2014    Past Surgical History:  Procedure Laterality Date  . NO PAST SURGERIES    . ROBOTIC ASSISTED LAPAROSCOPIC CHOLECYSTECTOMY-SINGLE SITE N/A 08/16/2014   Procedure: ROBOTIC ASSISTED LAPAROSCOPIC CHOLECYSTECTOMY-SINGLE SITE;  Surgeon: Natale Lay, MD;  Location: [facility] ORS;  Service: General;  Laterality: N/A;    No Known Allergies  Prior to Admission medications   Medication Sig Start Date End Date Taking? Authorizing Provider  ondansetron (ZOFRAN-ODT) 8 MG disintegrating tablet Take 1 tablet (8 mg total) by mouth every 8 (eight) hours as needed for nausea or vomiting. 06/25/21  Yes Jackelyne Sayer, Courtney Heys, CNM  Prenatal Vit-Fe Fumarate-FA (MULTIVITAMIN-PRENATAL) 27-0.8 MG TABS tablet Take 1 tablet by mouth daily at 12 noon.   Yes [provider]     Prenatal care site:[facility]   Social History: She  reports that she has never smoked. She has never used smokeless tobacco. She reports that she does not drink alcohol and does not use drugs.  Family History: family history includes Diabetes in her mother and paternal grandmother; Heart disease in her paternal grandmother; Hypertension in her father and mother.   Review of Systems: A full review of systems was performed and negative except as noted in the HPI.     Physical Exam:  Vital Signs: BP 120/72   Wt 154 lb (69.9 kg)   LMP 10/06/2020  (Exact Date)   BMI 28.17 kg/m  General: no acute distress.  HEENT: normocephalic, atraumatic Heart: regular rate & rhythm.  No murmurs/rubs/gallops Lungs: clear to auscultation bilaterally, normal respiratory effort Abdomen: soft, gravid, non-tender;  EFW: 7lbs Pelvic:   External: Normal external female genitalia  Cervix: Dilation: 3.5 / Effacement (%): 50 / Station: -3    Extremities: non-tender, symmetric, no edema bilaterally.    Neurologic: Alert & oriented x 3.    Results for orders placed or performed in visit on 07/15/21 (from the past 24 hour(s))  POC Urinalysis Dipstick OB     Status: None   Collection Time: 07/15/21 10:20 AM  Result Value Ref Range   Color, UA     Clarity, UA     Glucose, UA Negative Negative   Bilirubin, UA     Ketones, UA     Spec Grav, UA     Blood, UA     pH, UA     POC,PROTEIN,UA Negative Negative, Trace, Small (1+), Moderate (2+), Large (3+), 4+   Urobilinogen, UA     Nitrite, UA     Leukocytes, UA     Appearance     Odor      Pertinent Results:  Prenatal Labs: Blood type/Rh B positive   Antibody screen neg  Rubella Immune  Varicella Non Immune  RPR NR  HBsAg Neg  HIV NR  GC neg  Chlamydia neg  Genetic screening negative  1 hour GTT  163  3 hour GTT 76, 181, 142, 91  GBS Negative     SVE:  Dilation: 3.5 / Effacement (%): 50 / Station: -3    Cephalic by leopolds  No results found.  Assessment:  Kathy Kelley is a 26 y.o. G22P1001 female at [redacted]w[redacted]d with postdates induction.   Plan:  1. Admit to Labor & Delivery 2. CBC, T&S, Clrs, IVF, pitocin  3. GBS  negative 4. Consents obtained. 5. Continuous efm/toco 6. Pain management aware of all options, will ask when desired   ----- Carie Caddy, CNM  [facility]

## 2021-07-18 ENCOUNTER — Encounter: Payer: Self-pay | Admitting: Licensed Practical Nurse

## 2021-07-20 ENCOUNTER — Encounter: Payer: Self-pay | Admitting: Obstetrics and Gynecology

## 2021-07-20 ENCOUNTER — Inpatient Hospital Stay: Payer: 59 | Admitting: Anesthesiology

## 2021-07-20 ENCOUNTER — Inpatient Hospital Stay
Admission: EM | Admit: 2021-07-20 | Discharge: 2021-07-21 | DRG: 806 | Disposition: A | Discharge status: Home / Self Care | Payer: 59 | Attending: Obstetrics and Gynecology | Admitting: Obstetrics and Gynecology

## 2021-07-20 ENCOUNTER — Other Ambulatory Visit: Payer: Self-pay

## 2021-07-20 DIAGNOSIS — O99613 Diseases of the digestive system complicating pregnancy, third trimester: Secondary | ICD-10-CM | POA: Diagnosis not present

## 2021-07-20 DIAGNOSIS — Z3A40 40 weeks gestation of pregnancy: Secondary | ICD-10-CM

## 2021-07-20 DIAGNOSIS — K8020 Calculus of gallbladder without cholecystitis without obstruction: Secondary | ICD-10-CM | POA: Diagnosis present

## 2021-07-20 DIAGNOSIS — Z3490 Encounter for supervision of normal pregnancy, unspecified, unspecified trimester: Secondary | ICD-10-CM | POA: Diagnosis present

## 2021-07-20 DIAGNOSIS — O2662 Liver and biliary tract disorders in childbirth: Secondary | ICD-10-CM | POA: Diagnosis present

## 2021-07-20 DIAGNOSIS — O09893 Supervision of other high risk pregnancies, third trimester: Secondary | ICD-10-CM | POA: Diagnosis not present

## 2021-07-20 DIAGNOSIS — Q600 Renal agenesis, unilateral: Secondary | ICD-10-CM | POA: Diagnosis not present

## 2021-07-20 DIAGNOSIS — Z3A41 41 weeks gestation of pregnancy: Secondary | ICD-10-CM

## 2021-07-20 DIAGNOSIS — O480 Post-term pregnancy: Secondary | ICD-10-CM | POA: Diagnosis present

## 2021-07-20 DIAGNOSIS — Z3480 Encounter for supervision of other normal pregnancy, unspecified trimester: Secondary | ICD-10-CM

## 2021-07-20 DIAGNOSIS — O99810 Abnormal glucose complicating pregnancy: Principal | ICD-10-CM

## 2021-07-20 LAB — RAPID HIV SCREEN (HIV 1/2 AB+AG)
HIV 1/2 Antibodies: NONREACTIVE
HIV-1 P24 Antigen - HIV24: NONREACTIVE

## 2021-07-20 LAB — CBC
HCT: 38.7 % (ref 36.0–46.0)
Hemoglobin: 13 g/dL (ref 12.0–15.0)
MCH: 29 pg (ref 26.0–34.0)
MCHC: 33.6 g/dL (ref 30.0–36.0)
MCV: 86.2 fL (ref 80.0–100.0)
Platelets: 216 10*3/uL (ref 150–400)
RBC: 4.49 MIL/uL (ref 3.87–5.11)
RDW: 12.5 % (ref 11.5–15.5)
WBC: 6.9 10*3/uL (ref 4.0–10.5)
nRBC: 0 % (ref 0.0–0.2)

## 2021-07-20 LAB — TYPE AND SCREEN
ABO/RH(D): B POS
Antibody Screen: NEGATIVE

## 2021-07-20 LAB — RPR: RPR Ser Ql: NONREACTIVE

## 2021-07-20 LAB — ABO/RH: ABO/RH(D): B POS

## 2021-07-20 MED ORDER — AMMONIA AROMATIC IN INHA
RESPIRATORY_TRACT | Status: AC
  Filled 2021-07-20: qty 10

## 2021-07-20 MED ORDER — MISOPROSTOL 200 MCG PO TABS
ORAL_TABLET | ORAL | Status: AC
  Filled 2021-07-20: qty 4

## 2021-07-20 MED ORDER — FENTANYL-BUPIVACAINE-NACL 0.5-0.125-0.9 MG/250ML-% EP SOLN
EPIDURAL | Status: AC
  Filled 2021-07-20: qty 250

## 2021-07-20 MED ORDER — EPHEDRINE 5 MG/ML INJ: 10.0000 mg | INTRAVENOUS | Status: DC | PRN

## 2021-07-20 MED ORDER — WITCH HAZEL-GLYCERIN EX PADS
1.0000 "application " | MEDICATED_PAD | CUTANEOUS | Status: DC | PRN
  Administered 2021-07-20: 1 via TOPICAL
  Filled 2021-07-20: qty 100

## 2021-07-20 MED ORDER — LIDOCAINE-EPINEPHRINE (PF) 1.5 %-1:200000 IJ SOLN
INTRAMUSCULAR | Status: DC | PRN
  Administered 2021-07-20: 3 mL via EPIDURAL

## 2021-07-20 MED ORDER — TETANUS-DIPHTH-ACELL PERTUSSIS 5-2.5-18.5 LF-MCG/0.5 IM SUSY
0.5000 mL | PREFILLED_SYRINGE | Freq: Once | INTRAMUSCULAR | Status: DC
  Filled 2021-07-20: qty 0.5

## 2021-07-20 MED ORDER — SODIUM CHLORIDE 0.9 % IV SOLN
INTRAVENOUS | Status: DC | PRN
  Administered 2021-07-20: 7 mL via EPIDURAL

## 2021-07-20 MED ORDER — LACTATED RINGERS IV SOLN: 500.0000 mL | INTRAVENOUS | Status: DC | PRN

## 2021-07-20 MED ORDER — SOD CITRATE-CITRIC ACID 500-334 MG/5ML PO SOLN: 30.0000 mL | ORAL | Status: DC | PRN

## 2021-07-20 MED ORDER — OXYCODONE HCL 5 MG PO TABS: 5.0000 mg | ORAL_TABLET | ORAL | Status: DC | PRN

## 2021-07-20 MED ORDER — LACTATED RINGERS IV SOLN: 500.0000 mL | Freq: Once | INTRAVENOUS | Status: DC

## 2021-07-20 MED ORDER — BUTORPHANOL TARTRATE 2 MG/ML IJ SOLN: 1.0000 mg | INTRAMUSCULAR | Status: DC | PRN

## 2021-07-20 MED ORDER — TERBUTALINE SULFATE 1 MG/ML IJ SOLN: 0.2500 mg | Freq: Once | INTRAMUSCULAR | Status: DC | PRN

## 2021-07-20 MED ORDER — OXYCODONE HCL 5 MG PO TABS: 10.0000 mg | ORAL_TABLET | ORAL | Status: DC | PRN

## 2021-07-20 MED ORDER — PHENYLEPHRINE 80 MCG/ML (10ML) SYRINGE FOR IV PUSH (FOR BLOOD PRESSURE SUPPORT): 80.0000 ug | PREFILLED_SYRINGE | INTRAVENOUS | Status: DC | PRN

## 2021-07-20 MED ORDER — COCONUT OIL OIL
1.0000 "application " | TOPICAL_OIL | Status: DC | PRN
  Administered 2021-07-20: 1 via TOPICAL
  Filled 2021-07-20: qty 120

## 2021-07-20 MED ORDER — DIBUCAINE (PERIANAL) 1 % EX OINT
1.0000 "application " | TOPICAL_OINTMENT | CUTANEOUS | Status: DC | PRN
  Administered 2021-07-20: 1 via RECTAL
  Filled 2021-07-20: qty 28

## 2021-07-20 MED ORDER — FENTANYL-BUPIVACAINE-NACL 0.5-0.125-0.9 MG/250ML-% EP SOLN
12.0000 mL/h | EPIDURAL | Status: DC | PRN
  Administered 2021-07-20: 12 mL/h via EPIDURAL

## 2021-07-20 MED ORDER — LIDOCAINE HCL (PF) 1 % IJ SOLN
INTRAMUSCULAR | Status: AC
  Filled 2021-07-20: qty 30

## 2021-07-20 MED ORDER — SIMETHICONE 80 MG PO CHEW: 80.0000 mg | CHEWABLE_TABLET | ORAL | Status: DC | PRN

## 2021-07-20 MED ORDER — ONDANSETRON HCL 4 MG PO TABS: 4.0000 mg | ORAL_TABLET | ORAL | Status: DC | PRN

## 2021-07-20 MED ORDER — OXYTOCIN-SODIUM CHLORIDE 30-0.9 UT/500ML-% IV SOLN: 2.5000 [IU]/h | INTRAVENOUS | Status: DC

## 2021-07-20 MED ORDER — OXYTOCIN 10 UNIT/ML IJ SOLN
INTRAMUSCULAR | Status: AC
  Filled 2021-07-20: qty 2

## 2021-07-20 MED ORDER — ONDANSETRON HCL 4 MG/2ML IJ SOLN: 4.0000 mg | Freq: Four times a day (QID) | INTRAMUSCULAR | Status: DC | PRN

## 2021-07-20 MED ORDER — OXYTOCIN-SODIUM CHLORIDE 30-0.9 UT/500ML-% IV SOLN
1.0000 m[IU]/min | INTRAVENOUS | Status: DC
  Administered 2021-07-20: 2 m[IU]/min via INTRAVENOUS
  Filled 2021-07-20: qty 500

## 2021-07-20 MED ORDER — PRENATAL MULTIVITAMIN CH
1.0000 | ORAL_TABLET | Freq: Every day | ORAL | Status: DC
  Administered 2021-07-20 – 2021-07-21 (×2): 1 via ORAL
  Filled 2021-07-20 (×2): qty 1

## 2021-07-20 MED ORDER — ZOLPIDEM TARTRATE 5 MG PO TABS: 5.0000 mg | ORAL_TABLET | Freq: Every evening | ORAL | Status: DC | PRN

## 2021-07-20 MED ORDER — BENZOCAINE-MENTHOL 20-0.5 % EX AERO
1.0000 "application " | INHALATION_SPRAY | CUTANEOUS | Status: DC | PRN
  Administered 2021-07-20: 1 via TOPICAL
  Filled 2021-07-20: qty 56

## 2021-07-20 MED ORDER — ACETAMINOPHEN 325 MG PO TABS
650.0000 mg | ORAL_TABLET | ORAL | Status: DC | PRN
  Administered 2021-07-20 – 2021-07-21 (×2): 650 mg via ORAL
  Filled 2021-07-20 (×2): qty 2

## 2021-07-20 MED ORDER — DIPHENHYDRAMINE HCL 25 MG PO CAPS: 25.0000 mg | ORAL_CAPSULE | Freq: Four times a day (QID) | ORAL | Status: DC | PRN

## 2021-07-20 MED ORDER — LIDOCAINE HCL (PF) 1 % IJ SOLN
INTRAMUSCULAR | Status: DC | PRN
  Administered 2021-07-20: 2 mL via SUBCUTANEOUS
  Administered 2021-07-20: 3 mL via SUBCUTANEOUS

## 2021-07-20 MED ORDER — DOCUSATE SODIUM 100 MG PO CAPS: 100.0000 mg | ORAL_CAPSULE | Freq: Two times a day (BID) | ORAL | Status: DC

## 2021-07-20 MED ORDER — ONDANSETRON HCL 4 MG/2ML IJ SOLN: 4.0000 mg | INTRAMUSCULAR | Status: DC | PRN

## 2021-07-20 MED ORDER — IBUPROFEN 600 MG PO TABS
600.0000 mg | ORAL_TABLET | Freq: Four times a day (QID) | ORAL | Status: DC
  Administered 2021-07-20 – 2021-07-21 (×3): 600 mg via ORAL
  Filled 2021-07-20 (×3): qty 1

## 2021-07-20 MED ORDER — DIPHENHYDRAMINE HCL 50 MG/ML IJ SOLN: 12.5000 mg | INTRAMUSCULAR | Status: DC | PRN

## 2021-07-20 MED ORDER — LIDOCAINE HCL (PF) 1 % IJ SOLN: 30.0000 mL | INTRAMUSCULAR | Status: DC | PRN

## 2021-07-20 MED ORDER — LACTATED RINGERS IV SOLN: INTRAVENOUS | Status: DC

## 2021-07-20 MED ORDER — OXYTOCIN BOLUS FROM INFUSION
333.0000 mL | Freq: Once | INTRAVENOUS | Status: DC
  Administered 2021-07-20: 333 mL via INTRAVENOUS

## 2021-07-20 NOTE — Discharge Summary (Signed)
Postpartum Discharge Summary  Date of Service updated 07/20/2021     Patient Name: Kathy Kelley DOB: [birth year] MRN: [id number]  Date of admission: 07/20/2021 Delivery date:07/20/2021  Delivering provider: Imagene Riches  Date of discharge: 07/21/2021  Admitting diagnosis: Encounter for induction of labor [Z34.90] Intrauterine pregnancy: [redacted]w[redacted]d    Secondary diagnosis:  Principal Problem:   Encounter for induction of labor Active Problems:   Postpartum care following vaginal delivery   Encounter for care or examination of lactating mother  Additional problems: none    Discharge diagnosis: Term Pregnancy Delivered                                              Post partum procedures: none Augmentation: Pitocin Complications: None  Hospital course: Induction of Labor With Vaginal Delivery   26y.o. yo G2P1001 at 480w0das admitted to the hospital 07/20/2021 for induction of labor.  Indication for induction: Postdates.  Patient had an uncomplicated labor course as follows: Membrane Rupture Time/Date: 12:26 PM ,07/20/2021   Delivery Method:Vaginal, Spontaneous  Episiotomy: None  Lacerations:  Labial;Vaginal  Details of delivery can be found in separate delivery note.  Patient had a routine postpartum course. Patient is discharged home 07/21/21.  Newborn Data: Birth date:07/20/2021  Birth time:1:26 PM  Gender:Female  Living status:Living  Apgars: ,  Weight:3070 g   Magnesium Sulfate received: No BMZ received: No Rhophylac:N/A MMR:N/A T-DaP:Given prenatally Flu: No Transfusion:No  Physical exam  Vitals:   07/20/21 1921 07/20/21 2343 07/21/21 0404 07/21/21 0807  BP: 117/71 124/73 122/74 113/70  Pulse: 62 81 71 80  Resp:  17  18  Temp: 98.3 F (36.8 C) 98.4 F (36.9 C) 97.8 F (36.6 C) 98.8 F (37.1 C)  TempSrc: Oral Oral  Oral  SpO2: 98% 98% 99% 100%  Weight:      Height:       General: alert, cooperative, and no distress Lochia: appropriate Uterine Fundus:  firm Incision: N/A DVT Evaluation: No evidence of DVT seen on physical exam. Labs: Lab Results  Component Value Date   WBC 10.5 07/21/2021   HGB 10.6 (L) 07/21/2021   HCT 32.6 (L) 07/21/2021   MCV 87.2 07/21/2021   PLT 175 07/21/2021      Latest Ref Rng & Units 06/23/2021    9:36 AM  CMP  Glucose 70 - 99 mg/dL 103    BUN 6 - 20 mg/dL 3    Creatinine 0.57 - 1.00 mg/dL 0.65    Sodium 134 - 144 mmol/L 138    Potassium 3.5 - 5.2 mmol/L 4.0    Chloride 96 - 106 mmol/L 103    CO2 20 - 29 mmol/L 19    Calcium 8.7 - 10.2 mg/dL 8.8    Total Protein 6.0 - 8.5 g/dL 6.0    Total Bilirubin 0.0 - 1.2 mg/dL 0.3    Alkaline Phos 44 - 121 IU/L 203    AST 0 - 40 IU/L 19    ALT 0 - 32 IU/L 14     Edinburgh Score:    07/20/2021   11:58 PM  Edinburgh Postnatal Depression Scale Screening Tool  I have been able to laugh and see the funny side of things. 0  I have looked forward with enjoyment to things. 0  I have blamed myself unnecessarily when things went wrong.  2  I have been anxious or worried for no good reason. 1  I have felt scared or panicky for no good reason. 0  Things have been getting on top of me. 0  I have been so unhappy that I have had difficulty sleeping. 0  I have felt sad or miserable. 0  I have been so unhappy that I have been crying. 1  The thought of harming myself has occurred to me. 0  Edinburgh Postnatal Depression Scale Total 4      After visit meds:  Allergies as of 07/21/2021   No Known Allergies      Medication List     STOP taking these medications    ondansetron 8 MG disintegrating tablet Commonly known as: ZOFRAN-ODT       TAKE these medications    multivitamin-prenatal 27-0.8 MG Tabs tablet Take 1 tablet by mouth daily at 12 noon.         Discharge home in stable condition Infant Feeding: Breast Infant Disposition:home with mother Discharge instruction: per After Visit Summary and Postpartum booklet. Activity: Advance as tolerated.  Pelvic rest for 6 weeks.  Diet: routine diet Anticipated Birth Control: IUD Postpartum Appointment:6 weeks Additional Postpartum F/U:  PRN Future Appointments:No future appointments.  Follow up Visit:  Follow-up Information     Imagene Riches, CNM. Schedule an appointment as soon as possible for a visit in 6 week(s).   Specialties: Obstetrics, Gynecology Why: Please notify [facility] if you plan on having and IUD or Nexplanon placed Contact information: [address] [place] [address] [phone]                     07/21/2021 Rod Can, CNM

## 2021-07-20 NOTE — Anesthesia Procedure Notes (Signed)
Epidural Patient location during procedure: OB  Staffing Anesthesiologist: Corinda Gubler, MD Performed: anesthesiologist   Preanesthetic Checklist Completed: patient identified, IV checked, site marked, risks and benefits discussed, surgical consent, monitors and equipment checked, pre-op evaluation and timeout performed  Epidural Patient position: sitting Prep: ChloraPrep Patient monitoring: heart rate, continuous pulse ox and blood pressure Approach: midline Location: L4-L5 Injection technique: LOR saline  Needle:  Needle type: Tuohy  Needle gauge: 17 G Needle length: 9 cm Needle insertion depth: 6 cm Catheter type: closed end Catheter size: 19 Gauge Catheter at skin depth: 11 cm Test dose: negative and 1.5% lidocaine with Epi 1:200 K  Assessment Sensory level: T10 Events: blood aspirated, injection not painful, no injection resistance, no paresthesia and negative IV test  Additional Notes two attempts (first attempt resulted in +heme on aspiration; epidural promptly removed, replaced one level lower) Pt. Evaluated and documentation done after procedure finished. Patient identified. Risks/Benefits/Options discussed with patient including but not limited to bleeding, infection, nerve damage, paralysis, failed block, incomplete pain control, headache, blood pressure changes, nausea, vomiting, reactions to medication both or allergic, itching and postpartum back pain. Confirmed with bedside nurse the patient's most recent platelet count. Confirmed with patient that they are not currently taking any anticoagulation, have any bleeding history or any family history of bleeding disorders. Patient expressed understanding and wished to proceed. All questions were answered. Sterile technique was used throughout the entire procedure. Please see nursing notes for vital signs. Test dose was given through epidural catheter and negative prior to continuing to dose epidural or start infusion.  Warning signs of high block given to the patient including shortness of breath, tingling/numbness in hands, complete motor block, or any concerning symptoms with instructions to call for help. Patient was given instructions on fall risk and not to get out of bed. All questions and concerns addressed with instructions to call with any issues or inadequate analgesia.     Patient tolerated the insertion well without immediate complications.  Reason for block: procedure for painReason for block:procedure for pain

## 2021-07-20 NOTE — H&P (Signed)
OB History & Physical   History of Present Illness:  Chief Complaint: postdates induction of labor  HPI:  Kathy Kelley is a 26 y.o. G51P1001 female at [redacted]w[redacted]d dated by LMP c/w 11 week ultrasound.  Her pregnancy has been complicated by Kidney congenitally absent, right; Calculus of gallbladder without cholecystitis without obstruction.    She denies contractions prior to admission.   She denies leakage of fluid.   She denies vaginal bleeding.   She reports fetal movement.    Total weight gain for pregnancy: -10.4 kg   Obstetrical Problem List: pregnancy Problems (from 11/29/20 to present)     Problem Noted Resolved   Abnormal glucose tolerance test (GTT) during pregnancy, antepartum 06/23/2021 by Caren Macadam, MD No   Overview Addendum 06/23/2021  8:43 AM by Caren Macadam, MD    1 hr 163, Equivocal  76. 181, 142, 91--> if FH is > than expected get growth [place]       Supervision of other normal pregnancy, antepartum 11/29/2020 by Imagene Riches, CNM No   Overview Addendum 07/15/2021 10:08 AM by [place] Derry, CNM     Nursing Staff Provider  Office Location  [facility] Dating  LMP = 11 weeks [place]  Language  English Anatomy US  Incomplete RVOT small choroid plexus cyst; later resolved  Flu Vaccine  Declines Genetic Screen  NIPS: Normal XX- sawyer  TDaP vaccine    Hgb A1C or  GTT Early : N/A Third trimester :  163-->  3hr (76, 181, 142,   Covid no   LAB RESULTS   Rhogam  N/A Blood Type B/Positive/-- (11/08 0956) B+  Feeding Plan Breast Antibody Negative (11/08 0956)negative  Contraception  IUD Rubella 4.31 (11/08 0956)immune  Circumcision n/a RPR Non Reactive (03/07 0948) NR  Pediatrician   HBsAg Negative (11/08 0956) neg  Support Person Ryan HIV Non Reactive (03/07 0948)NR  Prenatal Classes  Varicella Non immune    GBS Negative/-- (05/08 0900)(For PCN allergy, check sensitivities)   BTL Consent no    VBAC Consent N/A Pap  05/03/20 NILM  Pelvis Tested 7lbs 1oz                 Maternal Medical History:   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   Vaginal delivery 04-2014    Past Surgical History:  Procedure Laterality Date   NO PAST SURGERIES     ROBOTIC ASSISTED LAPAROSCOPIC CHOLECYSTECTOMY-SINGLE SITE N/A 08/16/2014   Procedure: ROBOTIC ASSISTED LAPAROSCOPIC Lillian;  Surgeon: Sherri Rad, MD;  Location: [facility] ORS;  Service: General;  Laterality: N/A;    No Known Allergies  Prior to Admission medications   Medication Sig Start Date End Date Taking? Authorizing Provider  ondansetron (ZOFRAN-ODT) 8 MG disintegrating tablet Take 1 tablet (8 mg total) by mouth every 8 (eight) hours as needed for nausea or vomiting. 06/25/21  Yes Dominic, Nunzio Cobbs, CNM  Prenatal Vit-Fe Fumarate-FA (MULTIVITAMIN-PRENATAL) 27-0.8 MG TABS tablet Take 1 tablet by mouth daily at 12 noon.   Yes [provider]    OB History  Gravida Para Term Preterm AB Living  2 1 1     1   SAB IAB Ectopic Multiple Live Births          1    # Outcome Date GA Lbr Len/2nd Weight Sex Delivery Anes PTL Lv  2 Current  1 Term 05/09/14 [redacted]w[redacted]d  3204 g F Vag-Spont   LIV    Prenatal care site: [facility]  Social History: She  reports that she has never smoked. She has never used smokeless tobacco. She reports that she does not drink alcohol and does not use drugs.  Family History: family history includes Diabetes in her mother and paternal grandmother; Heart disease in her paternal grandmother; Hypertension in her father and mother.    Review of Systems:  Review of Systems  Constitutional:  Negative for chills and fever.  HENT:  Negative for congestion, ear discharge, ear pain, hearing loss, sinus pain and sore throat.   Eyes:  Negative for blurred vision and double vision.  Respiratory:  Negative for cough, shortness of breath and wheezing.    Cardiovascular:  Negative for chest pain, palpitations and leg swelling.  Gastrointestinal:  Negative for abdominal pain, blood in stool, constipation, diarrhea, heartburn, melena, nausea and vomiting.  Genitourinary:  Negative for dysuria, flank pain, frequency, hematuria and urgency.  Musculoskeletal:  Negative for back pain, joint pain and myalgias.  Skin:  Negative for itching and rash.  Neurological:  Negative for dizziness, tingling, tremors, sensory change, speech change, focal weakness, seizures, loss of consciousness, weakness and headaches.  Endo/Heme/Allergies:  Negative for environmental allergies. Does not bruise/bleed easily.  Psychiatric/Behavioral:  Negative for depression, hallucinations, memory loss, substance abuse and suicidal ideas. The patient is not nervous/anxious and does not have insomnia.     Physical Exam:  BP 128/84 (BP Location: Left Arm)   Pulse 90   Temp 98.7 F (37.1 C) (Oral)   Resp 16   Ht 5\' 5"  (1.651 m)   Wt 69.9 kg   LMP 10/06/2020 (Exact Date)   BMI 25.63 kg/m   Constitutional: Well nourished, well developed female in no acute distress.  HEENT: normal Skin: Warm and dry.  Cardiovascular: Regular rate and rhythm.   Extremity:  no edema   Respiratory: Clear to auscultation bilateral. Normal respiratory effort Abdomen: FHT present Back: no CVAT Neuro: DTRs 2+, Cranial nerves grossly intact Psych: Alert and Oriented x3. No memory deficits. Normal mood and affect.  MS: normal gait, normal bilateral lower extremity ROM/strength/stability.  Pelvic exam: per RN A. White 3.5/50/-3   Baseline FHR: 145 beats/min   Variability: moderate   Accelerations: present   Decelerations: absent Contractions: present frequency: every 2-3 Overall assessment: reassuring   Lab Results  Component Value Date   SARSCOV2NAA Not Detected 02/03/2019    Assessment:  Kathy Kelley is a 26 y.o. G72P1001 female at [redacted]w[redacted]d with postdates induction of labor.    Plan:  Admit to Labor & Delivery  CBC, T&S, Clrs, IVF GBS negative.   Fetal well-being: Category I Continue pitocin titration   Rod Can, CNM 07/20/2021 8:05 AM

## 2021-07-20 NOTE — Progress Notes (Signed)
Kathy Kelley is a 26 y.o. G2P1001 at [redacted]w[redacted]d by ultrasound admitted for induction of labor due to Post dates. Due date 07/13/2021.  Subjective: Now cvery comfortable with her epidural. Denies any rectal pressure.   Objective: BP 115/70   Pulse 67   Temp 98.7 F (37.1 C) (Oral)   Resp 16   Ht 5\' 4"  (1.626 m)   Wt 69.9 kg   LMP 10/06/2020 (Exact Date)   SpO2 99%   BMI 26.43 kg/m  No intake/output data recorded. No intake/output data recorded.  FHT:  FHR: 140 bpm, variability: moderate,  accelerations:  Present,  decelerations:  Present some early decels noted. UC:   regular, every 2-3 minutes SVE:   Dilation: 9 Effacement (%): 100 Station: 0 Exam by:: [email or web address] CNM  Labs: Lab Results  Component Value Date   WBC 6.9 07/20/2021   HGB 13.0 07/20/2021   HCT 38.7 07/20/2021   MCV 86.2 07/20/2021   PLT 216 07/20/2021    Assessment / Plan: Spontaneous labor, progressing normally  Labor: Progressing normally  Fetal Wellbeing:  Category I Pain Control:  Epidural I/D:  n/a Anticipated MOD:  NSVD  AROM performed- clear fluid seen. Will recheck within the hour and likely begin pushing.  09/19/2021 07/20/2021, 12:35 PM

## 2021-07-20 NOTE — Anesthesia Preprocedure Evaluation (Signed)
Anesthesia Evaluation  Patient identified by MRN, date of birth, ID band Patient awake    Reviewed: Allergy & Precautions, NPO status , Patient's Chart, lab work & pertinent test results  History of Anesthesia Complications Negative for: history of anesthetic complications  Airway Mallampati: II  TM Distance: >3 FB Neck ROM: Full    Dental no notable dental hx. (+) Teeth Intact   Pulmonary neg pulmonary ROS, neg sleep apnea, neg COPD, Patient abstained from smoking.Not current smoker,    Pulmonary exam normal breath sounds clear to auscultation       Cardiovascular Exercise Tolerance: Good METS(-) hypertension(-) CAD and (-) Past MI negative cardio ROS  (-) dysrhythmias  Rhythm:Regular Rate:Normal - Systolic murmurs    Neuro/Psych negative neurological ROS  negative psych ROS   GI/Hepatic neg GERD  ,(+)     (-) substance abuse  ,   Endo/Other  neg diabetes  Renal/GU Renal disease     Musculoskeletal   Abdominal   Peds  Hematology   Anesthesia Other Findings Past Medical History: 05/2014: Cholelithiasis No date: Congenital abnormality of kidney     Comment:  RIGHT KIDNEY ABSENT PER U/S 04-2014: Vaginal delivery  Reproductive/Obstetrics (+) Pregnancy                             Anesthesia Physical Anesthesia Plan  ASA: 2  Anesthesia Plan: Epidural   Post-op Pain Management:    Induction:   PONV Risk Score and Plan: 2 and Treatment may vary due to age or medical condition and Ondansetron  Airway Management Planned: Natural Airway  Additional Equipment:   Intra-op Plan:   Post-operative Plan:   Informed Consent: I have reviewed the patients History and Physical, chart, labs and discussed the procedure including the risks, benefits and alternatives for the proposed anesthesia with the patient or authorized representative who has indicated his/her understanding and  acceptance.       Plan Discussed with: Surgeon  Anesthesia Plan Comments: (Discussed R/B/A of neuraxial anesthesia technique with patient: - rare risks of spinal/epidural hematoma, nerve damage, infection - Risk of PDPH - Risk of itching - Risk of nausea and vomiting - Risk of poor block necessitating replacement of epidural. - Risk of allergic reactions. Patient voiced understanding.)        Anesthesia Quick Evaluation

## 2021-07-20 NOTE — Progress Notes (Signed)
Kathy Kelley is a 26 y.o. G2P1001 at [redacted]w[redacted]d by ultrasound admitted for induction of labor due to Post dates. Due date 07/13/2021.  Subjective:  She reprots that her contractions feel more intense under pitocin. She is interested in epidural anesthesia   Objective: BP 128/84 (BP Location: Left Arm)   Pulse 90   Temp 98.7 F (37.1 C) (Oral)   Resp 16   Ht 5\' 4"  (1.626 m)   Wt 69.9 kg   LMP 10/06/2020 (Exact Date)   BMI 26.43 kg/m  No intake/output data recorded. No intake/output data recorded.  FHT:  FHR: 150 bpm, variability: moderate,  accelerations:  Present,  decelerations:  Absent UC:   regular, every 2-3 minutes SVE:   Dilation: 4 Effacement (%): 80, 90 Station: -2 Exam by:: [email or web address]: Lab Results  Component Value Date   WBC 6.9 07/20/2021   HGB 13.0 07/20/2021   HCT 38.7 07/20/2021   MCV 86.2 07/20/2021   PLT 216 07/20/2021    Assessment / Plan: Induction of labor due to postterm,  progressing well on pitocin  Labor: Progressing on Pitocin, will continue to increase then AROM Anesthesia contacted for epidural placement.  Fetal Wellbeing:  Category I Pain Control:  Labor support without medications I/D:  n/a Anticipated MOD:  NSVD Will proceed with epidural, then likely AROM  09/19/2021 07/20/2021, 9:26 AM

## 2021-07-21 LAB — CBC
HCT: 32.6 % — ABNORMAL LOW (ref 36.0–46.0)
Hemoglobin: 10.6 g/dL — ABNORMAL LOW (ref 12.0–15.0)
MCH: 28.3 pg (ref 26.0–34.0)
MCHC: 32.5 g/dL (ref 30.0–36.0)
MCV: 87.2 fL (ref 80.0–100.0)
Platelets: 175 10*3/uL (ref 150–400)
RBC: 3.74 MIL/uL — ABNORMAL LOW (ref 3.87–5.11)
RDW: 12.5 % (ref 11.5–15.5)
WBC: 10.5 10*3/uL (ref 4.0–10.5)
nRBC: 0 % (ref 0.0–0.2)

## 2021-07-21 NOTE — Progress Notes (Signed)
Patient discharged home with family.  Discharge instructions, when to follow up, and prescriptions reviewed with patient.  Patient verbalized understanding. Patient will be escorted out by auxiliary.   

## 2021-07-21 NOTE — Discharge Instructions (Signed)

## 2021-07-21 NOTE — Lactation Note (Signed)
This note was copied from a baby's chart. Lactation Consultation Note  Patient Name: Kathy Kelley [id number] Date: 07/21/2021 Reason for consult: Initial assessment;Term;RN request Age:24 hours  Maternal Data Has patient been taught Hand Expression?: No Does the patient have breastfeeding experience prior to this delivery?: No (Did not BF her 57yr old)  Mom desires to breastfeed, but also to pump and bottle feed once she is at home.  Feeding Mother's Current Feeding Choice: Breast Milk Nipple Type: Slow - flow  LATCH Score  Attempted feeding at the breast, however baby would not latch, would cry and push away.   Lactation Tools Discussed/Used    Interventions Interventions: Breast feeding basics reviewed;Assisted with latch;Breast massage;Hand express;Pre-pump if needed;Support pillows;DEBP;Hand pump;Education;Pace feeding  Bottle prepped with 83mL formula, attempted but baby would not suckle. LC at bedside to assist with feeding at the breast; unsuccessful. After calming techniques, and different nipple on bottle baby accepted bottle and all 63mL of formula without difficulties.  We reviewed normal course of lactation, milk supply and demand, management of breast fullness and how to encourage appropriate onset of milk supply and production. Encouraged offering of breast at each feeding with early cues when baby is still calm and has patience, and then offer supplement if baby doesn't appear satisfied.   Encouraged to pump post feedings to boost supply, and to ensure to pump minimum every 2-3 hours if baby does not go to the breast first.  Discharge Discharge Education: Engorgement and breast care;Outpatient recommendation Pump: Personal  Consult Status Consult Status: PRN  Outpatient lactation service information given. Whiteboard updated with LC name/number; encouraged to call with questions and for ongoing support as needed.  Danford Bad 07/21/2021, 10:38 AM

## 2021-07-21 NOTE — Anesthesia Postprocedure Evaluation (Signed)
Anesthesia Post Note  Patient: Kathy Kelley  Procedure(s) Performed: AN AD HOC LABOR EPIDURAL  Patient location during evaluation: Mother Baby Anesthesia Type: Epidural Level of consciousness: awake and alert Pain management: pain level controlled Vital Signs Assessment: post-procedure vital signs reviewed and stable Respiratory status: spontaneous breathing, nonlabored ventilation and respiratory function stable Cardiovascular status: stable Postop Assessment: no headache, no backache and epidural receding Anesthetic complications: no   No notable events documented.   Last Vitals:  Vitals:   07/20/21 2343 07/21/21 0404  BP: 124/73 122/74  Pulse: 81 71  Resp: 17   Temp: 36.9 C 36.6 C  SpO2: 98% 99%    Last Pain:  Vitals:   07/21/21 0401  TempSrc:   PainSc: 0-No pain                 Rosanne Gutting

## 2021-09-26 ENCOUNTER — Ambulatory Visit (INDEPENDENT_AMBULATORY_CARE_PROVIDER_SITE_OTHER): Payer: 59 | Admitting: Obstetrics

## 2021-09-26 ENCOUNTER — Encounter: Payer: Self-pay | Admitting: Obstetrics

## 2021-09-26 VITALS — BP 110/70 | Ht 62.0 in | Wt 145.0 lb

## 2021-09-26 DIAGNOSIS — Z30011 Encounter for initial prescription of contraceptive pills: Secondary | ICD-10-CM

## 2021-09-26 MED ORDER — NORETHINDRONE 0.35 MG PO TABS: 1.0000 | ORAL_TABLET | Freq: Every day | ORAL | 3 refills | Status: AC

## 2021-09-26 NOTE — Progress Notes (Signed)
Postpartum Visit  Chief Complaint:  Chief Complaint  Patient presents with   Postpartum Care    6 wk, no concerns   Contraception    Interested in [facility] pills    History of Present Illness: Patient is a 26 y.o. G2P1001 presents for postpartum visit.She is doing well,. Has decided to stay home fulltime. Denies any physical problems. Sheis breastfeeding.  Date of delivery: 07/20/2021 Type of delivery: Vaginal delivery - Vacuum or forceps assisted  no Episiotomy No.  Laceration: yes  Pregnancy or labor problems:  no Any problems since the delivery:  no  Newborn Details:  SINGLETON :  1. Baby's name: girl. 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 Authorizing Provider  Prenatal Vit-Fe Fumarate-FA (MULTIVITAMIN-PRENATAL) 27-0.8 MG TABS tablet Take 1 tablet by mouth daily at 12 noon.   Yes [provider]    No Known Allergies   Social History   Socioeconomic History   Marital status: Married    Spouse name: Not on file   Number of children: Not on file   Years of education: Not on file   Highest education level: Not on file  Occupational History   Not on file  Tobacco Use   Smoking status: Never   Smokeless tobacco: Never  Vaping Use   Vaping Use: Never used  Substance and Sexual Activity   Alcohol use: No    Drug use: No   Sexual activity: Not Currently    Birth control/protection: None  Other Topics Concern   Not on file  Social History Narrative   Not on file   Social Determinants of Health   Financial Resource Strain: Not on file  Food Insecurity: Not on file  Transportation Needs: Not on file  Physical Activity: Not on file  Stress: Not on file  Social Connections: Not on file  Intimate Partner Violence: Not on file    Family History  Problem Relation Age of Onset   Diabetes Mother    Hypertension Mother    Hypertension Father    Diabetes Paternal Grandmother    Heart disease Paternal Grandmother     ROS   Physical Exam BP 110/70   Ht 5\' 2"  (1.575 m)   Wt 145 lb (65.8 kg)   Breastfeeding Yes   BMI 26.52 kg/m   OBGyn Exam   Female Chaperone present during breast and/or pelvic exam.  Assessment: 26 y.o. G2P1001 presenting for 6 week postpartum visit  Plan: Problem List Items Addressed This Visit   None    1) Contraception Education given regarding options for contraception, including oral contraceptives.  2)  Pap - ASCCP guidelines and rational discussed.  Patient opts for annual screening interval  3) Patient underwent screening for postpartum depression with no concerns noted.  4) Follow up 1 year for routine annual exam  30,  CNM  09/26/2021 4:20 PM   09/26/2021 4:03 PM

## 2022-01-31 IMAGING — US US OB COMP +14 WK
1 series · 13 of 28 positions shown · non-contrast
Comparison: none

CLINICAL DATA: Second trimester pregnancy for fetal anatomy survey.

EXAM:
OBSTETRICAL ULTRASOUND >14 WKS

[Series 1: us ob comp + 14 wk · 13 of 89 slices shown]
[im 4/89]
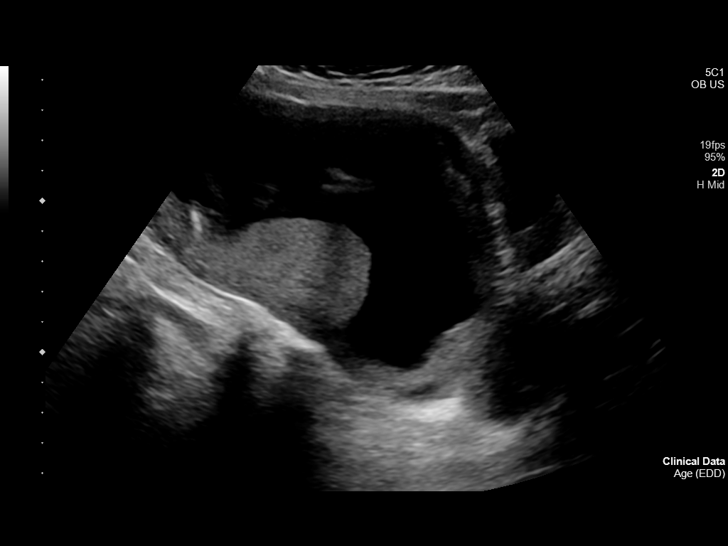
[im 10/89]
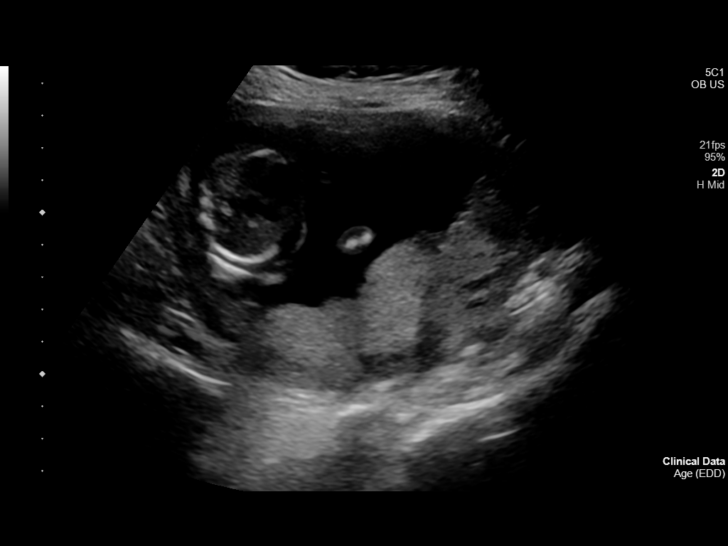
[im 17/89]
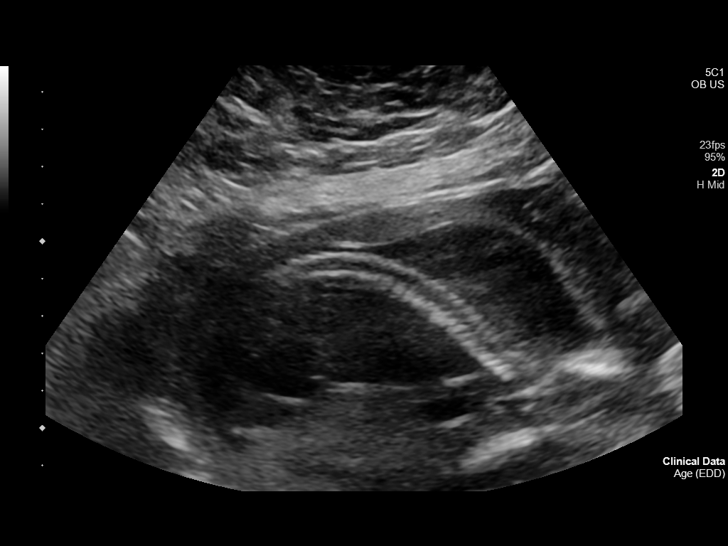
[im 23/89]
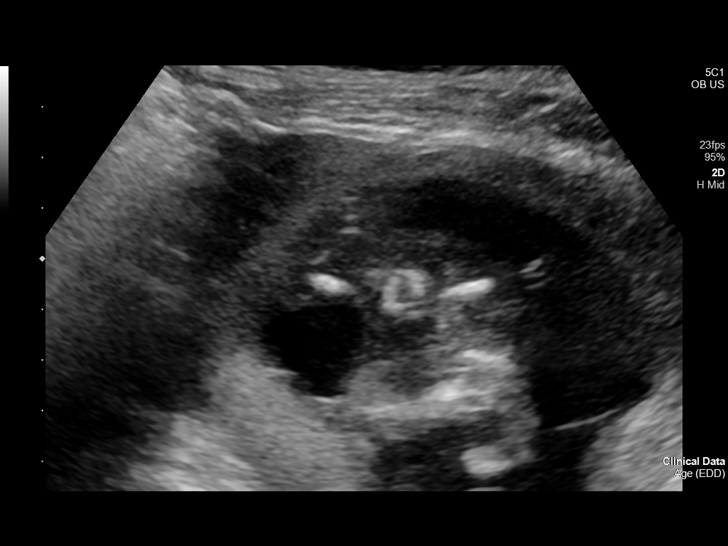
[im 30/89]
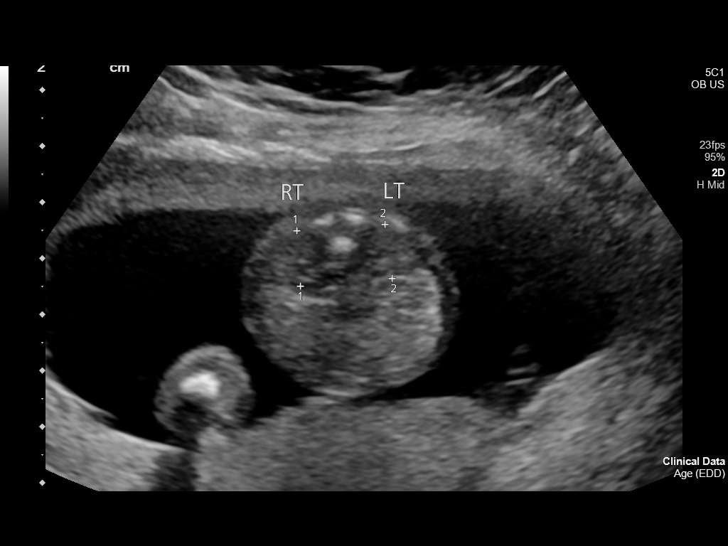
[im 36/89]
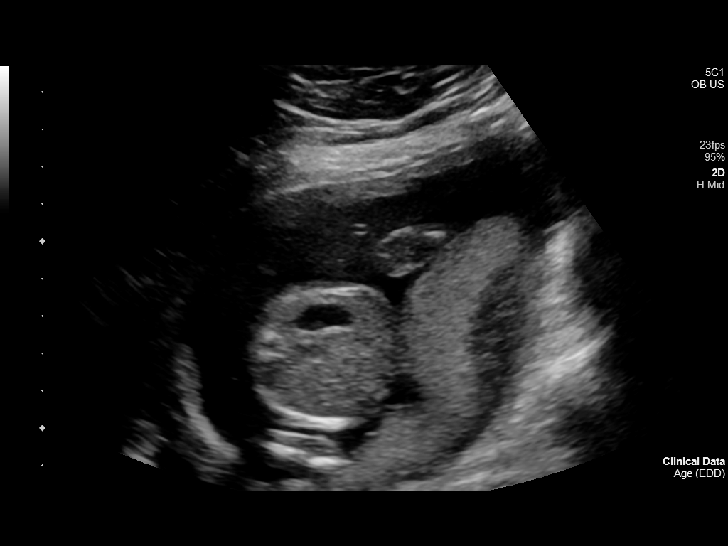
[im 46/89]
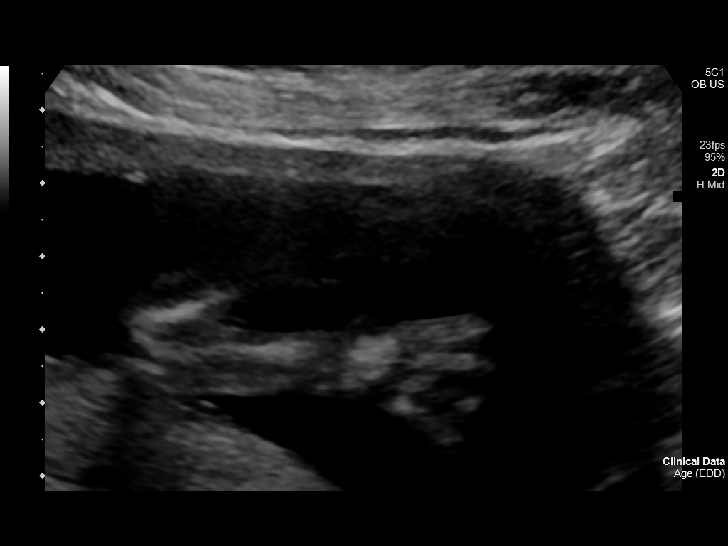
[im 53/89]
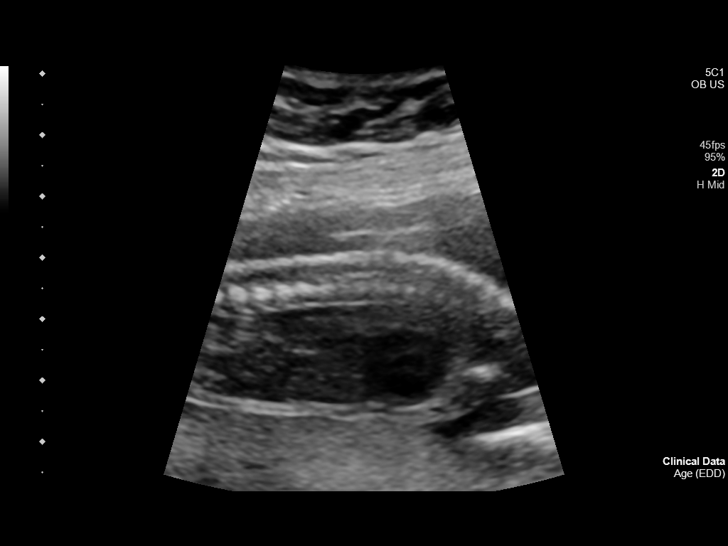
[im 59/89]
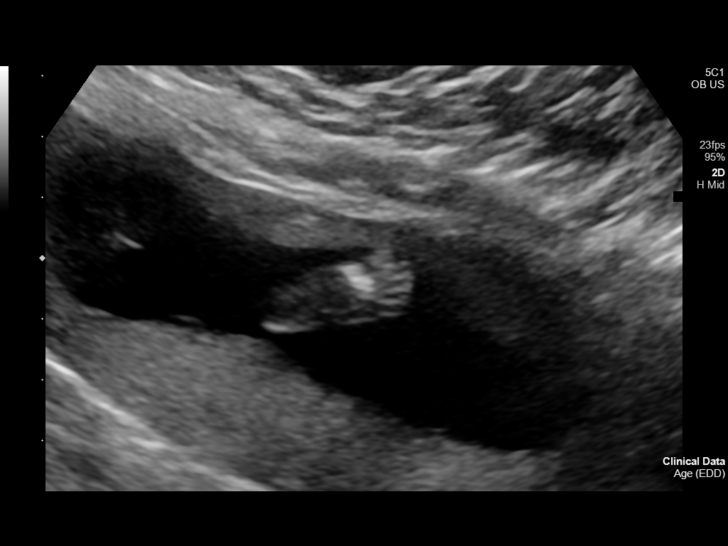
[im 66/89]
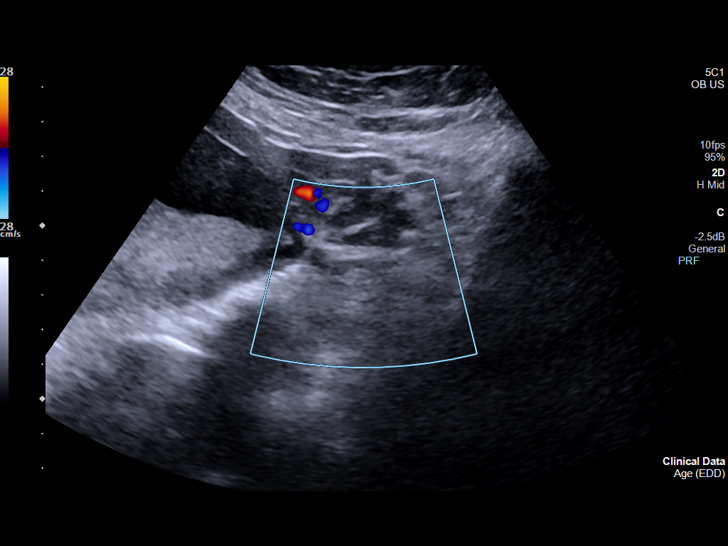
[im 72/89]
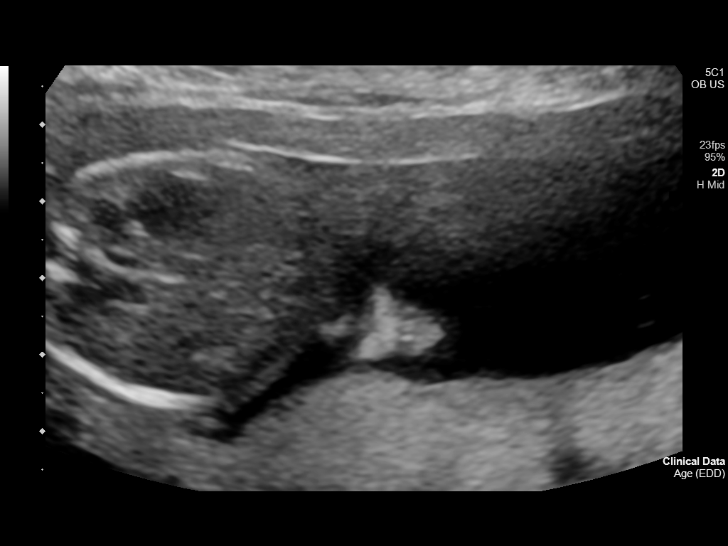
[im 79/89]
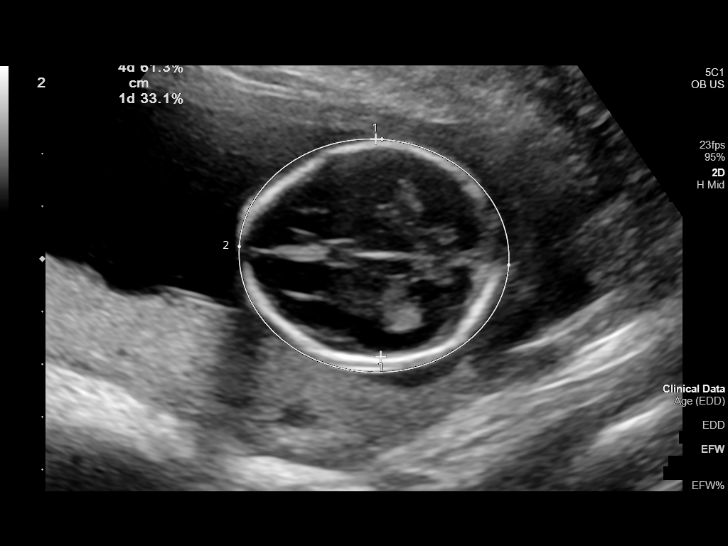
[im 85/89]
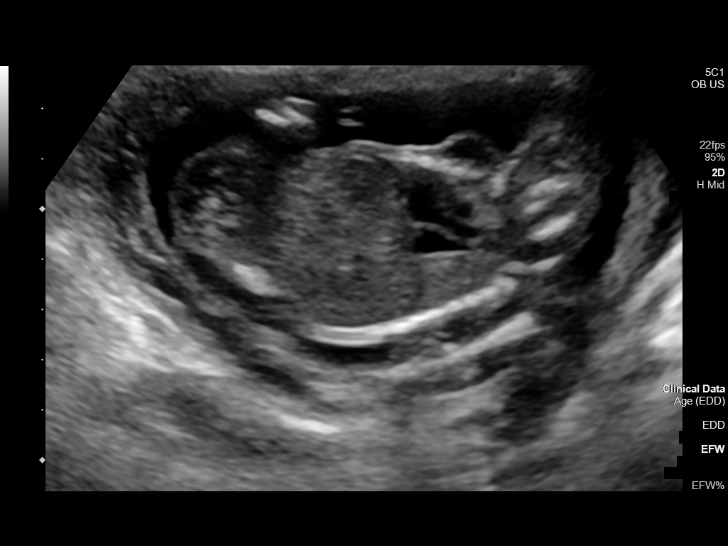

[13 of 28 positions shown; findings below may reference images not displayed]

FINDINGS: Number of Fetuses: 1

Heart Rate:  145 bpm

Movement: Yes

Presentation: Cephalic

Previa: No

Placental Location: Posterior

Amniotic Fluid (Subjective): Within normal limits

Amniotic Fluid (Objective):

Vertical pocket = 4.4cm

FETAL BIOMETRY

BPD: 4.1cm 18w 4d

HC:   15.1cm 18w 1d

AC:   12.2cm 17w 6d

FL:   2.5cm 17w 5d

Current Mean GA: 18w 1d US EDC: 07/14/2021

Assigned GA:  18w 2d Assigned EDC: 07/13/2021

FETAL ANATOMY

Lateral Ventricles: Appears normal; tiny cyst noted in left choroid
plexus

Thalami/CSP: Appears normal

Posterior Fossa:  Appears normal

Nuchal Region: Appears normal   NFT= 3.4 mm

Upper Lip: Appears normal

Spine: Appears normal

4 Chamber Heart on Left: Appears normal

LVOT: Appears normal

RVOT: Not visualized

Stomach on Left: Appears normal

3 Vessel Cord: Appears normal

Cord Insertion site: Appears normal

Kidneys: Appears normal

Bladder: Appears normal

Extremities: Appears normal

Technically difficult due to: Fetal position

Maternal Findings:

Cervix:  3.8 cm TA
IMPRESSION: Assigned GA currently 18 weeks 2 days.  Appropriate fetal growth.

No fetal anomalies identified, although RVOT could not be
visualized. Consider followup ultrasound in 3-4 weeks to complete
anatomic evaluation.

Tiny choroid plexus cyst noted, which is a soft sonographic marker
for aneuploidy, but is of doubtful clinical significance as an
isolated finding. Recommend correlation with prior aneuploidy
screening test results if performed; consider MFM
consultation/genetic counseling if desired.

## 2022-03-06 IMAGING — US US OB FOLLOW-UP
1 series · 15 of 28 positions shown · non-contrast
Comparison: none

CLINICAL DATA: Follow-up anatomy

EXAM:
OBSTETRIC 14+ WK ULTRASOUND FOLLOW-UP

[Series 1: us ob follow up · 15 of 56 slices shown]
[im 1/56]
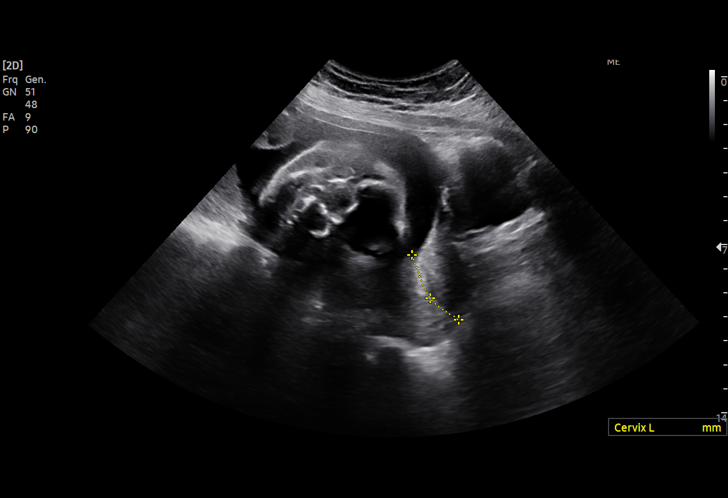
[im 5/56]
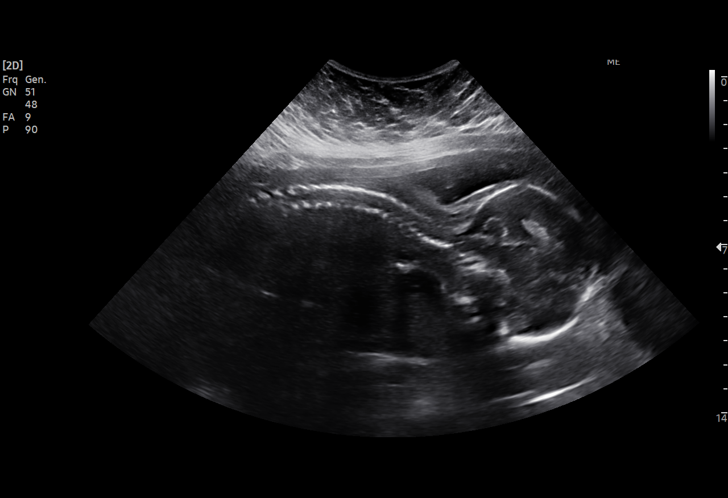
[im 9/56]
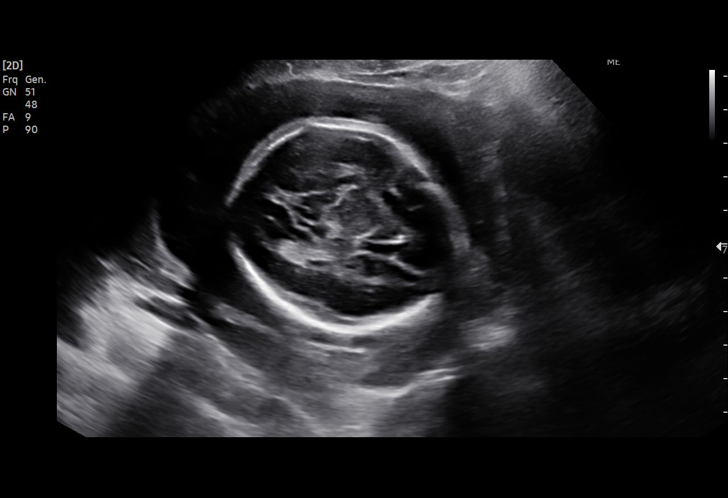
[im 13/56]
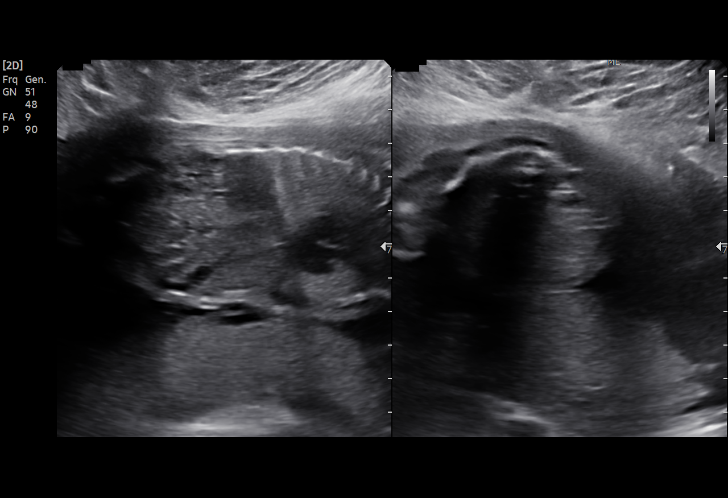
[im 17/56]
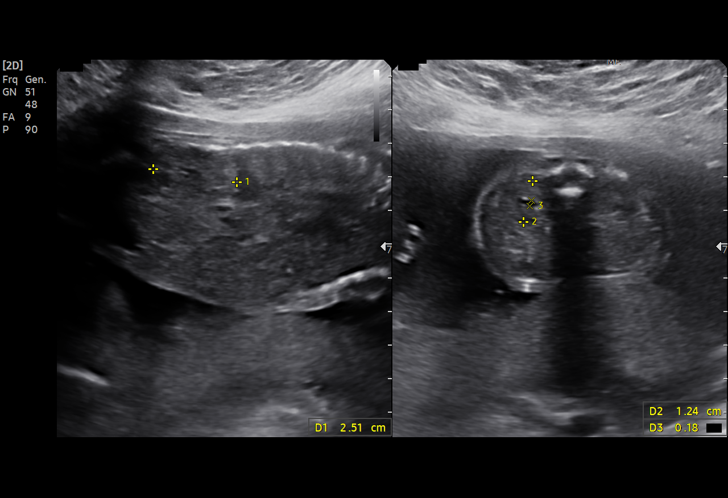
[im 21/56]
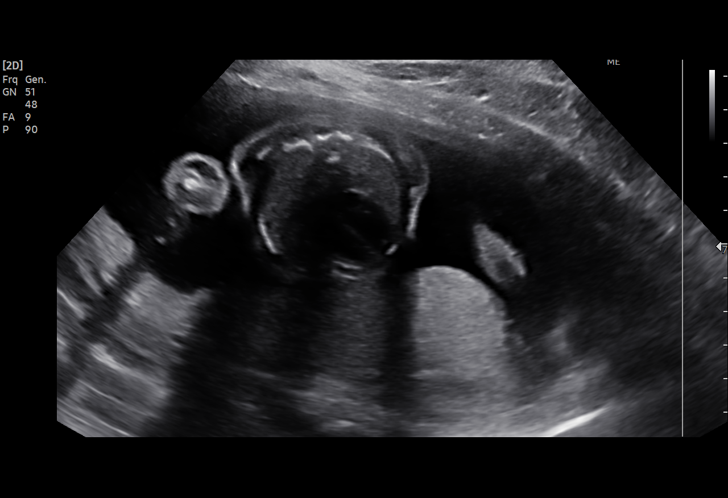
[im 25/56]
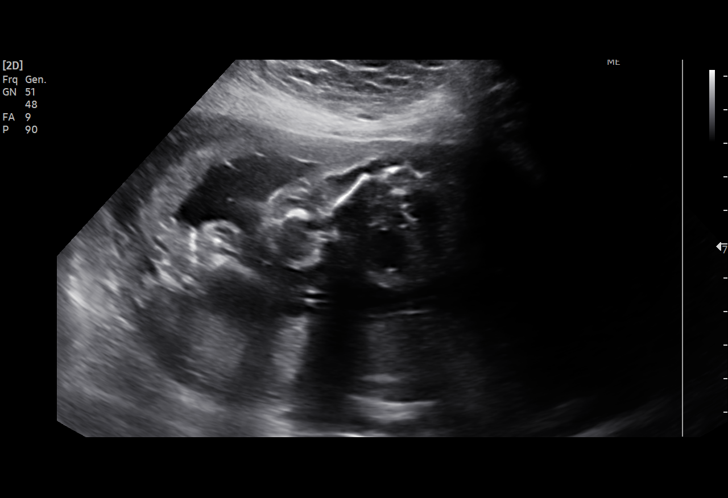
[im 29/56]
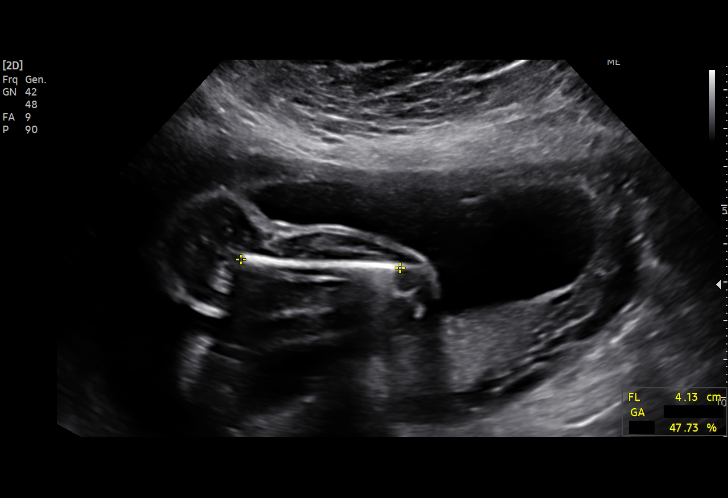
[im 31/56]
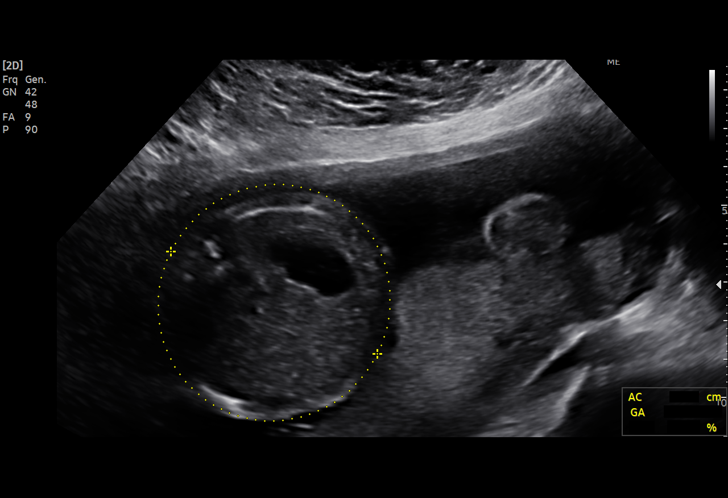
[im 35/56]
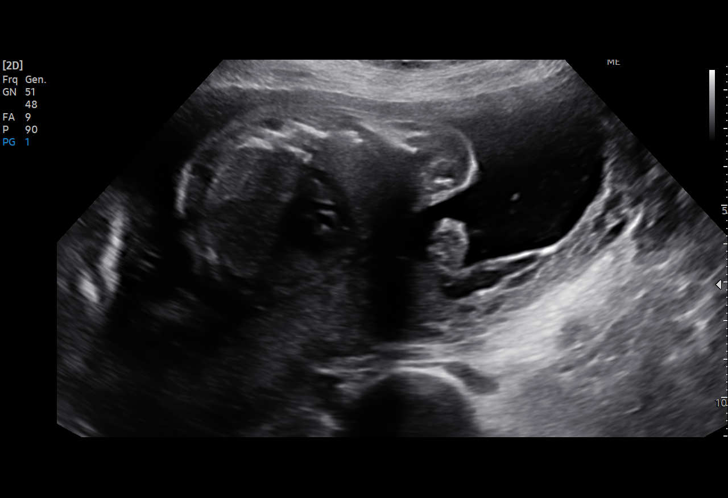
[im 39/56]
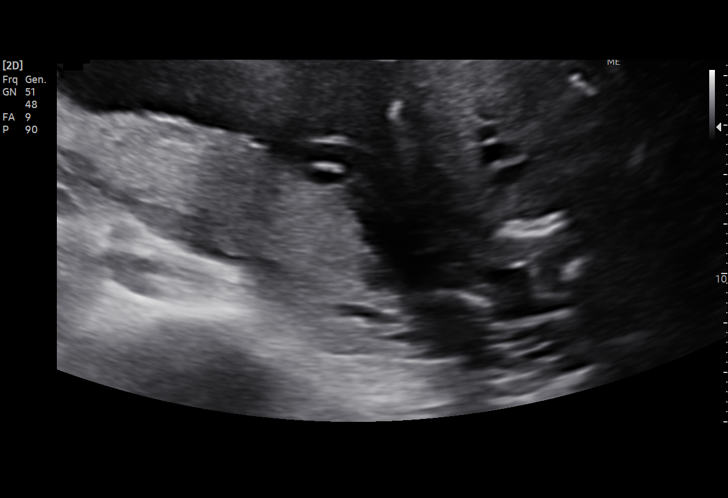
[im 43/56]
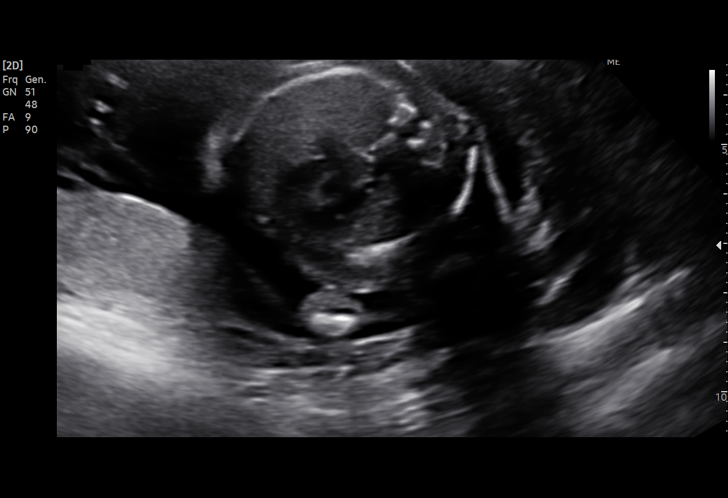
[im 47/56]
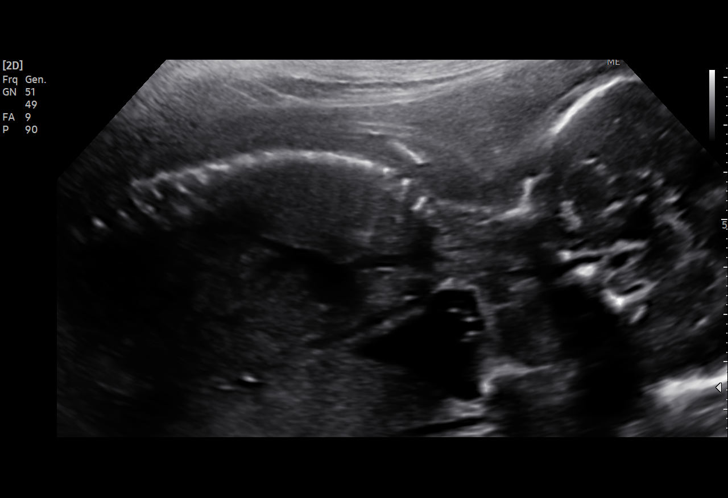
[im 51/56]
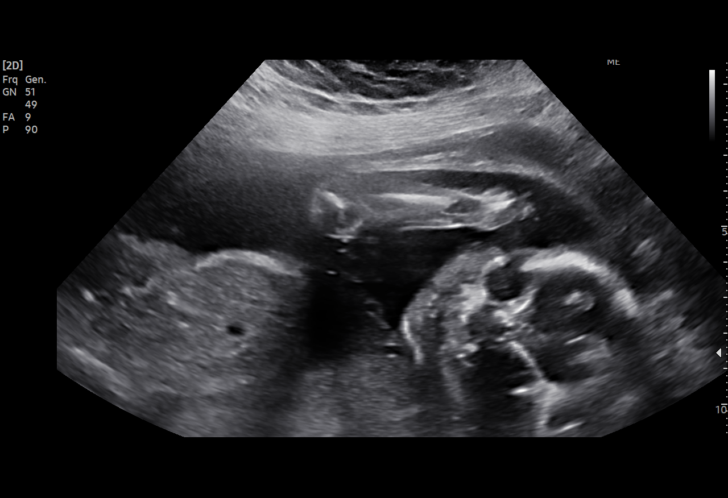
[im 56/56]
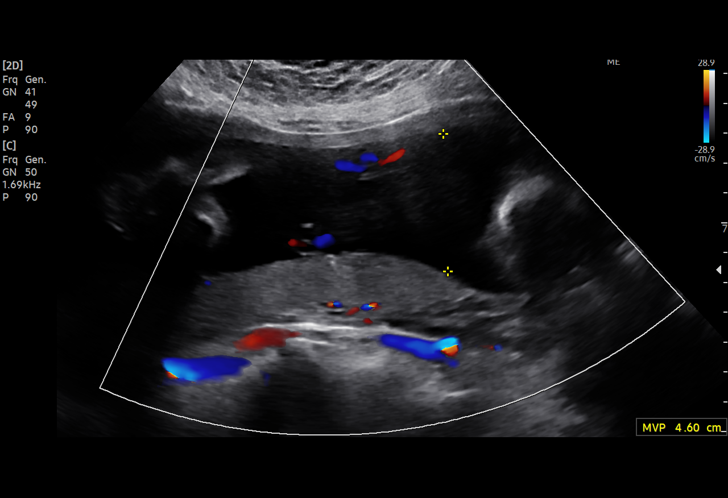

[15 of 28 positions shown; findings below may reference images not displayed]

FINDINGS: Number of Fetuses: 1

Heart Rate:  147 bpm

Movement: Yes

Presentation: Cephalic

Previa: No

Placental Location: Posterior

Amniotic Fluid (Subjective): Within normal limits

Amniotic Fluid (Objective):

Vertical pocket 4.6cm

FETAL BIOMETRY

BPD:  5.7cm 23w 4d

HC:    20.6cm 22w 5d

AC:    19cm 23w 5d

FL:    4.1cm 23w 1d

Current Mean GA: 23w 2d US EDC: 07/12/2021

Assigned GA: 23w 1d     Assigned EDC: 07/13/2021

Estimated Fetal Weight:  591g 53%ile

FETAL ANATOMY

Right outflow tract appears within normal limits. The previous
choroid plexus cyst is not visualized on the current study.
Remaining anatomy visualized on previous study.

Maternal Findings:

Cervix:  3.4 cm
IMPRESSION: Single live intrauterine pregnancy with estimated gestational age of
23 weeks 2 days. No acute abnormality identified. Follow-up anatomy
within normal limits.
